# Patient Record
Sex: Female | Born: 1985 | Race: White | Hispanic: No | Marital: Married | Smoking: Former smoker
Health system: Southern US, Community
[De-identification: ages and names within clinical notes are randomized; demographics above are authoritative.]

## PROBLEM LIST (undated history)

## (undated) ENCOUNTER — Inpatient Hospital Stay (HOSPITAL_COMMUNITY): Payer: Self-pay

## (undated) DIAGNOSIS — F419 Anxiety disorder, unspecified: Secondary | ICD-10-CM

## (undated) DIAGNOSIS — N730 Acute parametritis and pelvic cellulitis: Secondary | ICD-10-CM

## (undated) DIAGNOSIS — J45909 Unspecified asthma, uncomplicated: Secondary | ICD-10-CM

## (undated) DIAGNOSIS — N390 Urinary tract infection, site not specified: Secondary | ICD-10-CM

## (undated) DIAGNOSIS — A749 Chlamydial infection, unspecified: Secondary | ICD-10-CM

## (undated) DIAGNOSIS — F32A Depression, unspecified: Secondary | ICD-10-CM

## (undated) DIAGNOSIS — E119 Type 2 diabetes mellitus without complications: Secondary | ICD-10-CM

## (undated) DIAGNOSIS — B999 Unspecified infectious disease: Secondary | ICD-10-CM

## (undated) DIAGNOSIS — F329 Major depressive disorder, single episode, unspecified: Secondary | ICD-10-CM

## (undated) DIAGNOSIS — M797 Fibromyalgia: Secondary | ICD-10-CM

## (undated) DIAGNOSIS — N189 Chronic kidney disease, unspecified: Secondary | ICD-10-CM

## (undated) DIAGNOSIS — T4145XA Adverse effect of unspecified anesthetic, initial encounter: Secondary | ICD-10-CM

## (undated) DIAGNOSIS — A549 Gonococcal infection, unspecified: Secondary | ICD-10-CM

## (undated) HISTORY — DX: Chlamydial infection, unspecified: A74.9

## (undated) HISTORY — DX: Adverse effect of unspecified anesthetic, initial encounter: T41.45XA

## (undated) HISTORY — DX: Acute parametritis and pelvic cellulitis: N73.0

## (undated) HISTORY — DX: Type 2 diabetes mellitus without complications: E11.9

## (undated) HISTORY — DX: Unspecified asthma, uncomplicated: J45.909

## (undated) HISTORY — DX: Anxiety disorder, unspecified: F41.9

## (undated) HISTORY — DX: Gonococcal infection, unspecified: A54.9

## (undated) HISTORY — DX: Fibromyalgia: M79.7

## (undated) HISTORY — DX: Unspecified infectious disease: B99.9

---
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
## 2004-01-21 ENCOUNTER — Emergency Department (HOSPITAL_COMMUNITY): Admission: EM | Admit: 2004-01-21 | Discharge: 2004-01-21 | Payer: Self-pay | Admitting: Unknown Physician Specialty

## 2004-03-26 ENCOUNTER — Ambulatory Visit: Payer: Self-pay | Admitting: Internal Medicine

## 2004-06-04 ENCOUNTER — Emergency Department (HOSPITAL_COMMUNITY): Admission: EM | Admit: 2004-06-04 | Discharge: 2004-06-05 | Payer: Self-pay | Admitting: Emergency Medicine

## 2004-06-24 ENCOUNTER — Ambulatory Visit: Payer: Self-pay | Admitting: Internal Medicine

## 2004-07-21 ENCOUNTER — Emergency Department (HOSPITAL_COMMUNITY): Admission: EM | Admit: 2004-07-21 | Discharge: 2004-07-21 | Payer: Self-pay | Admitting: Family Medicine

## 2004-07-25 DIAGNOSIS — N730 Acute parametritis and pelvic cellulitis: Secondary | ICD-10-CM

## 2004-07-25 DIAGNOSIS — A549 Gonococcal infection, unspecified: Secondary | ICD-10-CM

## 2004-07-25 DIAGNOSIS — A749 Chlamydial infection, unspecified: Secondary | ICD-10-CM

## 2004-07-25 HISTORY — DX: Acute parametritis and pelvic cellulitis: N73.0

## 2004-07-25 HISTORY — DX: Gonococcal infection, unspecified: A54.9

## 2004-07-25 HISTORY — DX: Chlamydial infection, unspecified: A74.9

## 2004-08-11 ENCOUNTER — Ambulatory Visit: Payer: Self-pay | Admitting: Internal Medicine

## 2005-04-19 ENCOUNTER — Other Ambulatory Visit: Admission: RE | Admit: 2005-04-19 | Discharge: 2005-04-19 | Payer: Self-pay | Admitting: Obstetrics and Gynecology

## 2005-04-22 ENCOUNTER — Emergency Department (HOSPITAL_COMMUNITY): Admission: EM | Admit: 2005-04-22 | Discharge: 2005-04-23 | Payer: Self-pay | Admitting: Emergency Medicine

## 2005-04-30 ENCOUNTER — Inpatient Hospital Stay (HOSPITAL_COMMUNITY): Admission: EM | Admit: 2005-04-30 | Discharge: 2005-05-01 | Payer: Self-pay | Admitting: Emergency Medicine

## 2005-09-27 ENCOUNTER — Emergency Department (HOSPITAL_COMMUNITY): Admission: EM | Admit: 2005-09-27 | Discharge: 2005-09-27 | Payer: Self-pay | Admitting: *Deleted

## 2005-10-11 ENCOUNTER — Emergency Department (HOSPITAL_COMMUNITY): Admission: EM | Admit: 2005-10-11 | Discharge: 2005-10-11 | Payer: Self-pay | Admitting: *Deleted

## 2006-01-13 ENCOUNTER — Emergency Department (HOSPITAL_COMMUNITY): Admission: EM | Admit: 2006-01-13 | Discharge: 2006-01-14 | Payer: Self-pay | Admitting: Emergency Medicine

## 2006-05-11 ENCOUNTER — Emergency Department (HOSPITAL_COMMUNITY): Admission: EM | Admit: 2006-05-11 | Discharge: 2006-05-11 | Payer: Self-pay | Admitting: Emergency Medicine

## 2007-09-10 IMAGING — CR DG CHEST 2V
2 series · 2 of 2 positions shown · non-contrast
Comparison: 09/27/05.

CLINICAL DATA: Chest pain, difficulty breathing.
 CHEST ? 2 VIEW:

[view not recorded (1 of 2)]
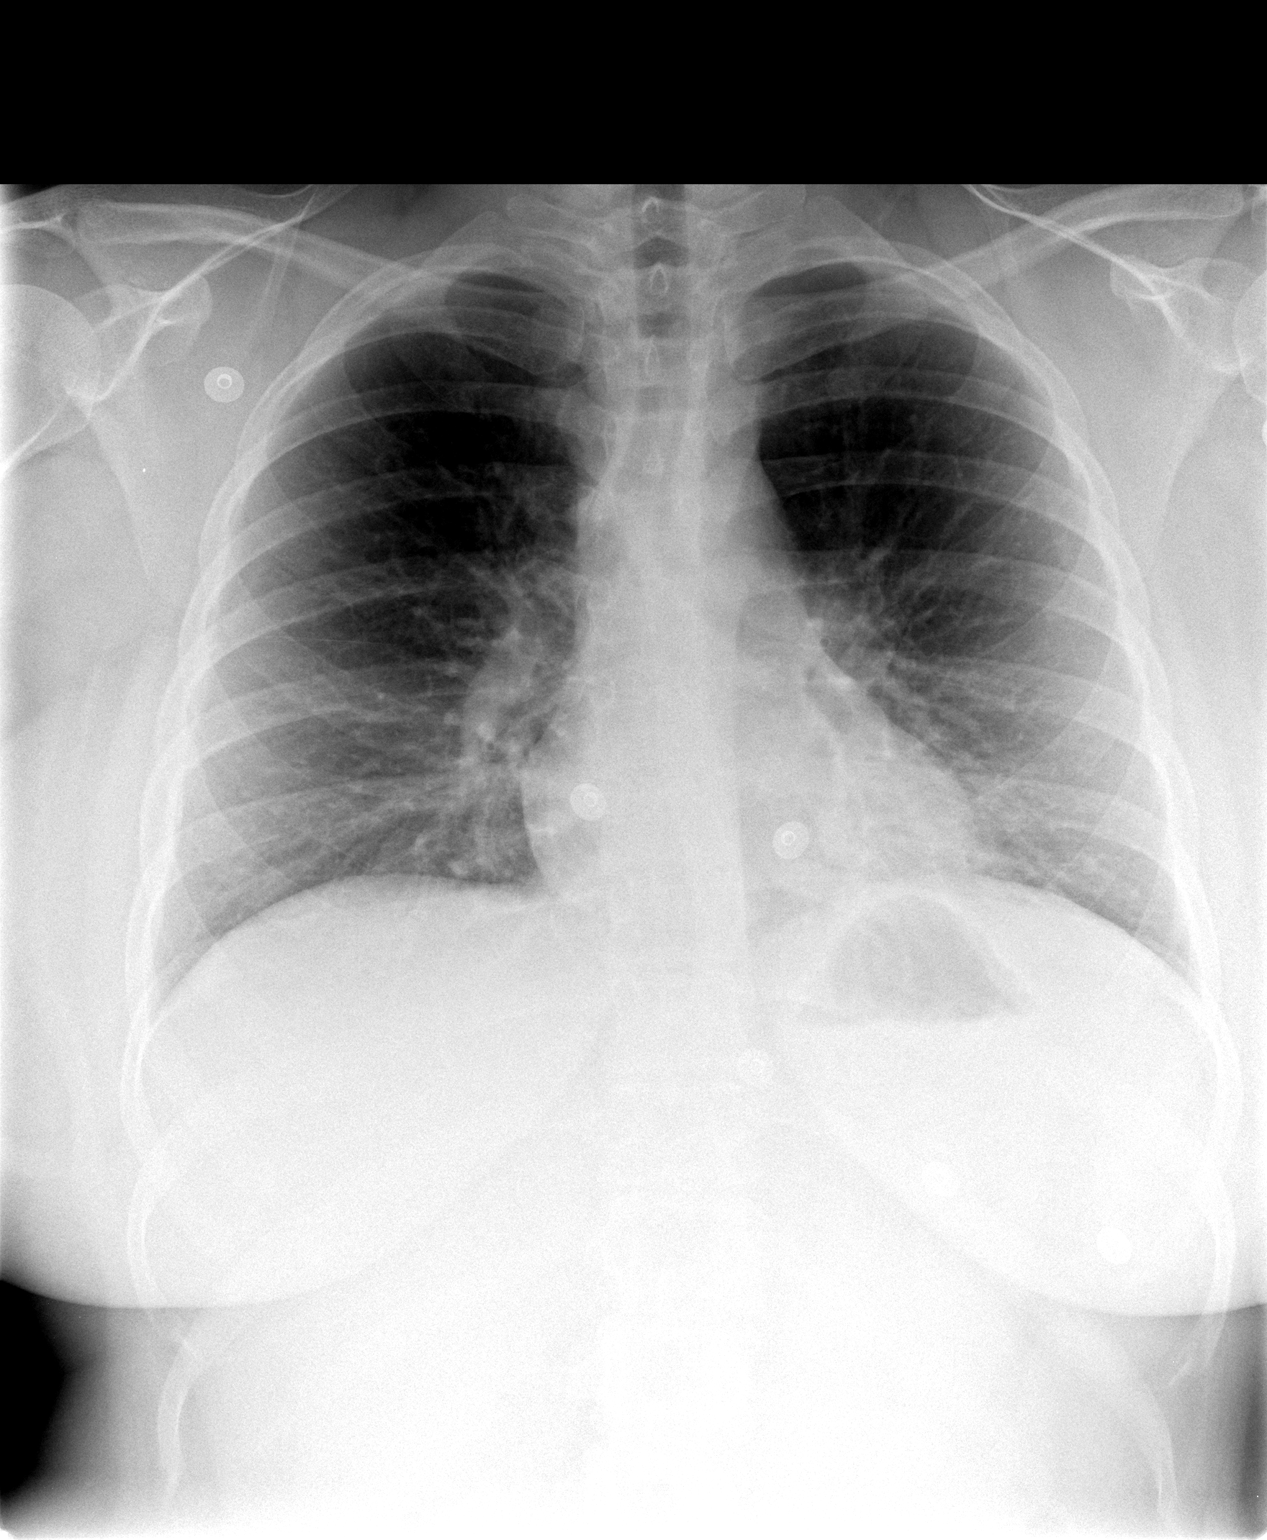

[view not recorded (2 of 2)]
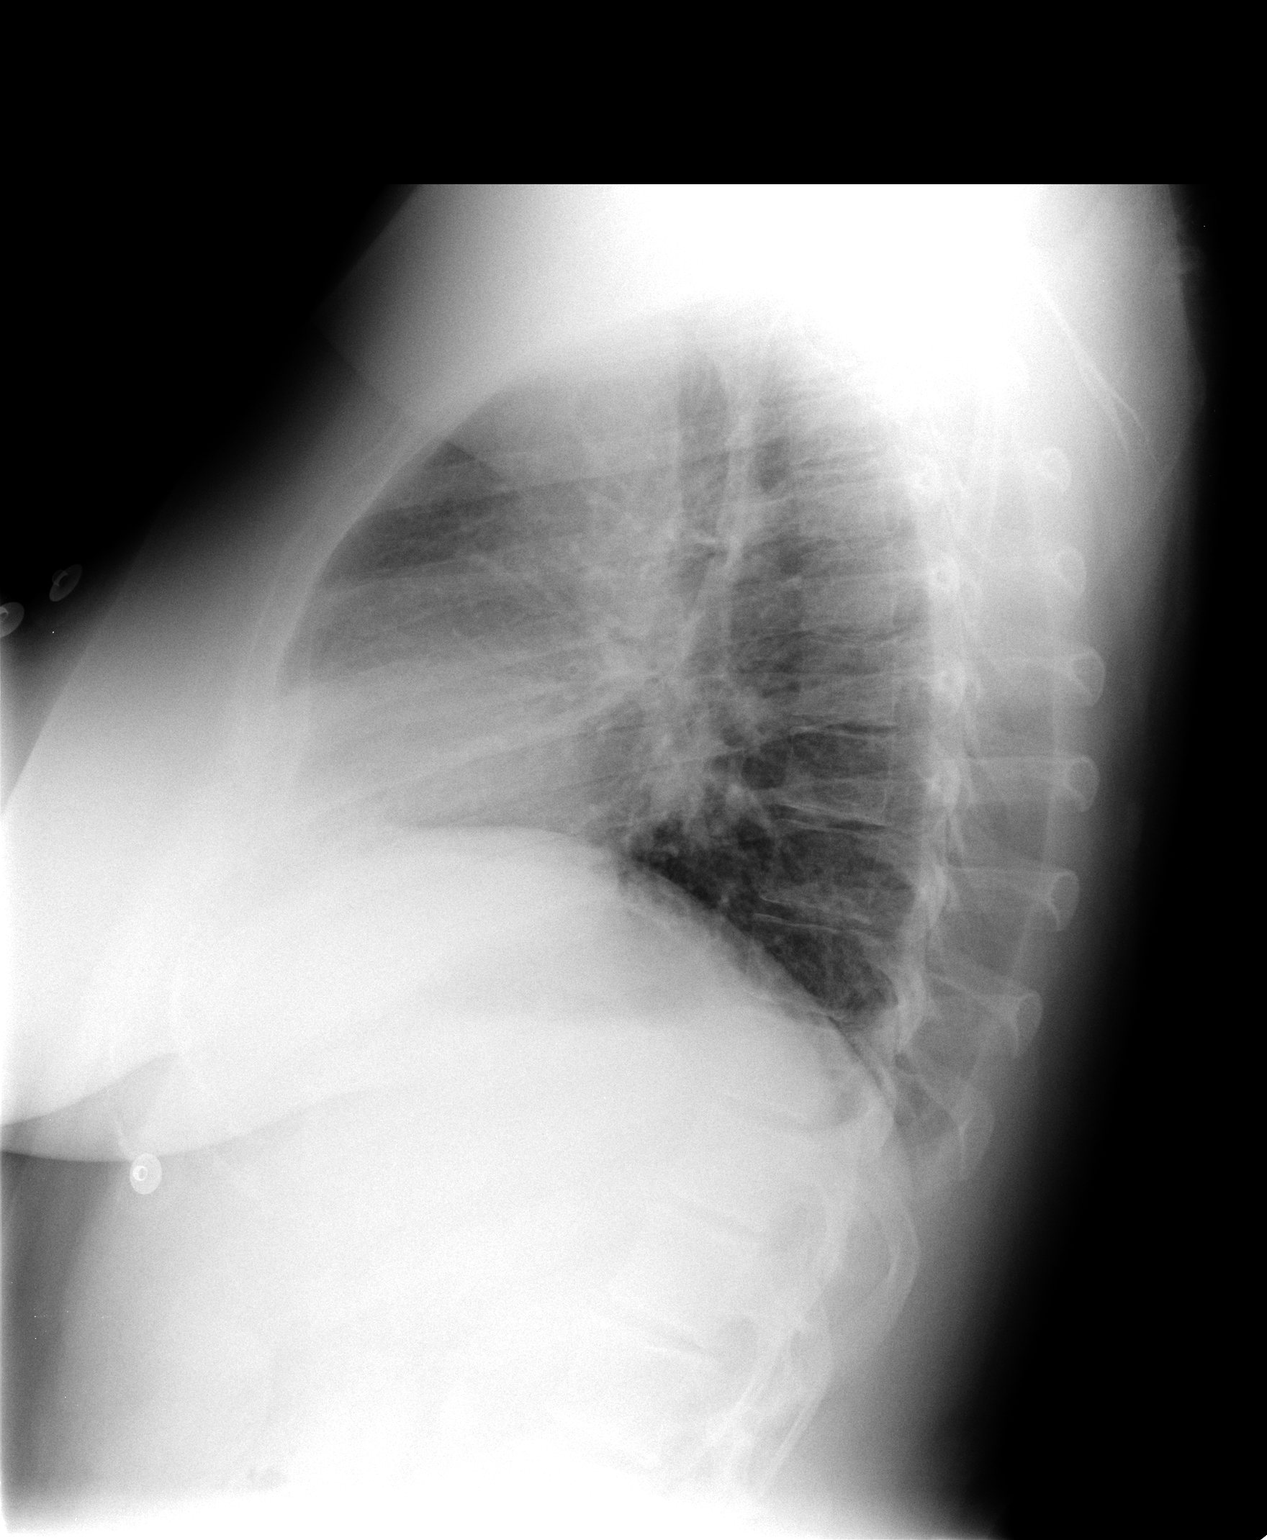

[2 of 2 positions shown; findings below may reference images not displayed]

FINDINGS: There is peribronchial thickening with more focal airspace disease in the lingula.  Right lung is clear.  The heart size is normal.  No effusions or focal bony abnormality.
IMPRESSION: Peribronchial thickening suggesting bronchitis.  More focal airspace opacity in the lingula could represent coexistent pneumonia or atelectasis.

## 2008-05-25 HISTORY — PX: APPENDECTOMY: SHX54

## 2009-07-25 DIAGNOSIS — T8859XA Other complications of anesthesia, initial encounter: Secondary | ICD-10-CM

## 2009-07-25 HISTORY — DX: Other complications of anesthesia, initial encounter: T88.59XA

## 2010-02-24 ENCOUNTER — Ambulatory Visit: Payer: Self-pay | Admitting: Obstetrics & Gynecology

## 2010-03-31 ENCOUNTER — Ambulatory Visit: Payer: Self-pay | Admitting: Obstetrics & Gynecology

## 2010-03-31 ENCOUNTER — Other Ambulatory Visit: Admission: RE | Admit: 2010-03-31 | Discharge: 2010-03-31 | Payer: Self-pay | Admitting: Obstetrics & Gynecology

## 2010-05-19 ENCOUNTER — Ambulatory Visit: Payer: Self-pay | Admitting: Obstetrics & Gynecology

## 2010-07-20 ENCOUNTER — Ambulatory Visit: Payer: Self-pay | Admitting: Obstetrics & Gynecology

## 2010-07-28 ENCOUNTER — Encounter: Payer: Self-pay | Admitting: Obstetrics & Gynecology

## 2010-07-28 LAB — CONVERTED CEMR LAB: TSH: 1.272 microintl units/mL (ref 0.350–4.500)

## 2010-12-07 NOTE — Assessment & Plan Note (Signed)
[name], [name]              ACCOUNT NO.:  [id number]   MEDICAL RECORD NO.:  [id number]          PATIENT TYPE:  POB   LOCATION:  [facility] [place]         FACILITY:  [facility]   PHYSICIAN:  Allie Bossier, MD        DATE OF BIRTH:  [birth year]   DATE OF SERVICE:  03/31/2010                                  CLINIC NOTE   Megan Castaneda is a 25 year old single white gravida 1, para 1.  She has a 60-  year-old son in [place].  She comes in here for annual exam.  She has  two complaints.  The first is that she has had some spotting on a daily  basis since she had her Mirena put in on August 3 of this year.  She  understands this is a normal side effect and will eventually go away.  Her other complaint is that of longstanding back pain.  She, in fact,  during her pregnancy was on narcotics and Flexeril and she will [facility]  [facility], but she is complaining of  some significant lower back pain.  Other past medical history besides  the lower back pain is morbid obesity and asthma.   PAST SURGICAL HISTORY:  Appendectomy, cholecystectomy, wisdom teeth  extraction.   SOCIAL HISTORY:  She smokes less than a pack a day and denies drugs or  alcohol use.  No latex allergies.   DRUG ALLERGIES:  AMOXICILLIN, SULFA.   CURRENT MEDICATIONS:  Prenatal vitamins daily, Tylenol daily, Naprosyn  as necessary.  She has a Mirena IUD in place.  She uses a Proventil  inhaler as necessary (she has used it about 3 times this past week).   FAMILY HISTORY:  Significant for breast cancer in a maternal grandmother  and maternal aunt.  Her mother had gallbladder cancer and thyroid  cancer.  She denies family history of colon and GYN cancers.   REVIEW OF SYSTEMS:  She began living with her boyfriend after 1 month of  dating.  Last Pap smear was in 2010.  The rest of the review of systems  questions are negative.   PHYSICAL EXAMINATION:  GENERAL:  Well-nourished, well-hydrated pleasant  white female.  VITAL SIGNS:  Height 5 feet 1 inch, weight 202 pounds, blood pressure  122/76, pulse 76.  BREASTS:  Normal breast.  No skin changes, nipple discharges, or masses.  HEART:  Regular rate and rhythm.  LUNGS:  Clear to auscultation bilaterally.  ABDOMEN:  Obese, benign.  EXTERNAL GENITALIA:  No lesions.  Cervix, small amount of bloody mucus  in the cervical os.  The strings are seen.  Uterus is about 6-week size,  anteverted, mobile, nontender.  Adnexa are nontender without masses.   ASSESSMENT AND PLAN:  1. Annual exam.  Checked the Pap smear with cervical cultures.      Recommended self-breast and self-vulvar exams.  2. Spotting with Mirena.  I told her that this is a known side effect      and that she should try some ibuprofen.  I have also told her that      if in a couple of weeks it is still a  problem that she can call      this office and we will call her in a month of birth control pills.  3. Lower back pain.  I have agreed to write 20 Vicodin to last her      until her visit with a primary care Megan Castaneda but after that I told      her we will not be providing Vicodin from this office.      Allie Bossier, MD     MCD/MEDQ  D:  03/31/2010  T:  03/31/2010  Job:  [id number]

## 2010-12-07 NOTE — Assessment & Plan Note (Signed)
[name], [name]              ACCOUNT NO.:  [id number]   MEDICAL RECORD NO.:  [id number]          PATIENT TYPE:  POB   LOCATION:  [facility] [place]         FACILITY:  [facility]   PHYSICIAN:  Allie Bossier, MD        DATE OF BIRTH:  [birth year]   DATE OF SERVICE:  05/19/2010                                  CLINIC NOTE   Nature is a 25 year old single white gravida 1, para 1.  She has a 20-1/2-  year-old son.  She comes in because she would like to have her IUD  removed.  Initially she told me it was because she did not like her IUD,  but now she is telling me that she and her boyfriend are wanting to have  another baby.  I have counseled her extensively that she should  multivitamins and folic acid or prenatal vitamins for the next 6 weeks  prior to conception and use condoms or withdrawal until then.  She  understands.  The IUD was removed without problem and she will follow up  in a year or p.r.n. sooner.      Allie Bossier, MD     MCD/MEDQ  D:  05/19/2010  T:  05/19/2010  Job:  [id number]

## 2010-12-07 NOTE — Assessment & Plan Note (Signed)
[name], Megan Castaneda              ACCOUNT NO.:  [id number]   MEDICAL RECORD NO.:  [id number]          PATIENT TYPE:  POB   LOCATION:  [facility] [place]         FACILITY:  [facility]   PHYSICIAN:  Elsie Lincoln, MD      DATE OF BIRTH:  [birth year]   DATE OF SERVICE:  07/27/2010                                  CLINIC NOTE   The patient is a 25 year old female who presents for amenorrhea.  The  patient has not had appeared for 2-1/2 months.  She had an IUD removed  on May 04, 2010.  Her menstrual history prior to this was normal  menses up until she got pregnant.  She had a baby, then had IUD  inserted, then she had the IUD removed, I do not believe that 2-1/2  months of period with a diagnosis of amenorrhea, but the patient seems  worried.  We will give her Provera withdrawal bleed in hope this reset  her cycles.  We did talk about weight loss.  The patient does not  exercise and seems to not really care about her diet at this point.  We  talked about long-term maintenance of being obese and also obesity in  pregnancy.  She wants to get pregnant, but then says she does not want  to get pregnant because she can afford a baby.  We talked a little bit  about this.  I do think they are trying actively, so which is why she is  upset.  Her UPT is negative today.  Provera was given 10 mg a day for 10  days.  The patient to expect withdrawal bleed in 2 weeks.  If she has  not, she will come back in a month and then we will address any issues  after that.           ______________________________  Elsie Lincoln, MD     KL/MEDQ  D:  07/27/2010  T:  07/28/2010  Job:  [id number]
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
## 2012-04-20 ENCOUNTER — Telehealth: Payer: Self-pay | Admitting: Obstetrics and Gynecology

## 2012-04-20 NOTE — Telephone Encounter (Signed)
Tc to pt per telephone call. Pt with cough, body aches and nasal congestion. No fever. Temp-96.6. Informed pt may try plain Robitussin, plain Sudafed, normal saline nasal rinses, increase water intake/Vit C. No sore throat. If no improvement, pt to cb. Pt voices understanding.

## 2012-04-25 ENCOUNTER — Ambulatory Visit (INDEPENDENT_AMBULATORY_CARE_PROVIDER_SITE_OTHER): Payer: Medicaid Other | Admitting: Obstetrics and Gynecology

## 2012-04-25 DIAGNOSIS — Z331 Pregnant state, incidental: Secondary | ICD-10-CM

## 2012-04-25 MED ORDER — ONDANSETRON 8 MG PO TBDP: 8.0000 mg | ORAL_TABLET | Freq: Three times a day (TID) | ORAL | Status: DC | PRN

## 2012-04-26 ENCOUNTER — Other Ambulatory Visit: Payer: Self-pay | Admitting: Obstetrics and Gynecology

## 2012-04-26 LAB — PRENATAL PANEL VII
Antibody Screen: NEGATIVE
Basophils Absolute: 0 10*3/uL (ref 0.0–0.1)
Basophils Relative: 0 % (ref 0–1)
Eosinophils Relative: 1 % (ref 0–5)
HCT: 36.8 % (ref 36.0–46.0)
HIV: NONREACTIVE
Hepatitis B Surface Ag: NEGATIVE
Lymphocytes Relative: 20 % (ref 12–46)
Lymphs Abs: 2.2 10*3/uL (ref 0.7–4.0)
MCH: 32.8 pg (ref 26.0–34.0)
MCHC: 36.1 g/dL — ABNORMAL HIGH (ref 30.0–36.0)
Monocytes Absolute: 0.5 10*3/uL (ref 0.1–1.0)
Monocytes Relative: 5 % (ref 3–12)
Neutro Abs: 7.9 10*3/uL — ABNORMAL HIGH (ref 1.7–7.7)
Neutrophils Relative %: 74 % (ref 43–77)
Platelets: 208 10*3/uL (ref 150–400)
RBC: 4.06 MIL/uL (ref 3.87–5.11)
RDW: 12.7 % (ref 11.5–15.5)
Rh Type: POSITIVE
Rubella: 85.3 IU/mL — ABNORMAL HIGH
WBC: 10.7 10*3/uL — ABNORMAL HIGH (ref 4.0–10.5)

## 2012-04-30 DIAGNOSIS — F419 Anxiety disorder, unspecified: Secondary | ICD-10-CM | POA: Insufficient documentation

## 2012-04-30 DIAGNOSIS — Z331 Pregnant state, incidental: Secondary | ICD-10-CM | POA: Insufficient documentation

## 2012-04-30 DIAGNOSIS — E669 Obesity, unspecified: Secondary | ICD-10-CM | POA: Insufficient documentation

## 2012-05-01 ENCOUNTER — Encounter (HOSPITAL_COMMUNITY): Payer: Self-pay | Admitting: Obstetrics and Gynecology

## 2012-05-01 ENCOUNTER — Inpatient Hospital Stay (HOSPITAL_COMMUNITY)
Admission: AD | Admit: 2012-05-01 | Discharge: 2012-05-02 | Disposition: A | Discharge status: Home / Self Care | Payer: Medicaid Other | Source: Ambulatory Visit | Attending: Obstetrics and Gynecology | Admitting: Obstetrics and Gynecology

## 2012-05-01 DIAGNOSIS — N760 Acute vaginitis: Secondary | ICD-10-CM | POA: Insufficient documentation

## 2012-05-01 DIAGNOSIS — E860 Dehydration: Secondary | ICD-10-CM

## 2012-05-01 DIAGNOSIS — Z331 Pregnant state, incidental: Secondary | ICD-10-CM

## 2012-05-01 DIAGNOSIS — R110 Nausea: Secondary | ICD-10-CM

## 2012-05-01 DIAGNOSIS — A499 Bacterial infection, unspecified: Secondary | ICD-10-CM | POA: Insufficient documentation

## 2012-05-01 DIAGNOSIS — R109 Unspecified abdominal pain: Secondary | ICD-10-CM | POA: Insufficient documentation

## 2012-05-01 DIAGNOSIS — B9689 Other specified bacterial agents as the cause of diseases classified elsewhere: Secondary | ICD-10-CM | POA: Insufficient documentation

## 2012-05-01 DIAGNOSIS — O2390 Unspecified genitourinary tract infection in pregnancy, unspecified trimester: Secondary | ICD-10-CM | POA: Insufficient documentation

## 2012-05-01 LAB — URINE MICROSCOPIC-ADD ON

## 2012-05-01 LAB — URINALYSIS, ROUTINE W REFLEX MICROSCOPIC
Bilirubin Urine: NEGATIVE
Glucose, UA: NEGATIVE mg/dL
Ketones, ur: NEGATIVE mg/dL
Leukocytes, UA: NEGATIVE
Nitrite: NEGATIVE
Specific Gravity, Urine: >1.03 — ABNORMAL HIGH (ref 1.005–1.030)
pH: 5.5 (ref 5.0–8.0)

## 2012-05-01 LAB — WET PREP, GENITAL
Trich, Wet Prep: NONE SEEN
Yeast Wet Prep HPF POC: NONE SEEN

## 2012-05-01 MED ORDER — ONDANSETRON 4 MG PO TBDP
4.0000 mg | ORAL_TABLET | Freq: Once | ORAL | Status: AC
  Administered 2012-05-01: 4 mg via ORAL
  Filled 2012-05-01: qty 1

## 2012-05-01 MED ORDER — METRONIDAZOLE 500 MG PO TABS
500.0000 mg | ORAL_TABLET | Freq: Once | ORAL | Status: AC
  Administered 2012-05-01: 500 mg via ORAL
  Filled 2012-05-01: qty 1

## 2012-05-01 MED ORDER — METRONIDAZOLE 500 MG PO TABS: 500.0000 mg | ORAL_TABLET | Freq: Two times a day (BID) | ORAL | Status: DC

## 2012-05-01 NOTE — MAU Note (Signed)
Pt reports she was walking today and started having pressure, lower back pain, frequency and pressure with urination. Denies fever

## 2012-05-01 NOTE — MAU Provider Note (Signed)
History   [name] presented this evening in early pregnancy at [redacted]w[redacted]d She has a history of n&v and had been prescribed Zofran 4mg s q. 6hrly bu HS but has not received her MedicAid card and was unable to afford to fill it. Tonight has nausea and dehydration and states "symptoms of a UTI"  CSN: [id number]  Arrival date and time: 05/01/12 2139   None     Chief Complaint  Patient presents with  . Abdominal Pain   HPI  OB History    Grav Para Term Preterm Abortions TAB SAB Ect Mult Living   2 1 1       1       Past Medical History  Diagnosis Date  . Complication of anesthesia 2011    Has ongoing shoulder and back pain from epidural  . Asthma     triggered by dust;colds;has inhaler prn  . Infection     Yeast;not frequent  . Infection     UTI;not frequent  . Chlamydia 2006    was treated  . Gonorrhea 2006    was treated  . PID (acute pelvic inflammatory disease) 2006    Antibxs;found out after + GC/CT  . Bipolar 1 disorder     Meds in the past  . Anxiety     Meds in the past  . Fibromyalgia     Past Surgical History  Procedure Date  . Appendectomy 05/2008  . Wisdom tooth extraction 1999    All 4 removed    Family History  Problem Relation Age of Onset  . Thyroid disease Mother   . Cancer Mother     Gallbladder  . Diabetes Maternal Grandmother   . Diabetes Maternal Grandfather   . Dementia Paternal Grandmother   . Dementia Paternal Grandfather   . Bipolar disorder Maternal Grandmother   . Depression Mother   . Anxiety disorder Mother   . Depression Maternal Grandmother   . Anxiety disorder Maternal Grandmother   . Fibromyalgia Mother   . Fibromyalgia Maternal Grandmother     History  Substance Use Topics  . Smoking status: Former Smoker    Types: Cigarettes    Quit date: 07/25/2010  . Smokeless tobacco: Never Used  . Alcohol Use: No    Allergies:  Allergies  Allergen Reactions  . Amoxicillin Rash  . Sulfa Antibiotics Rash     Prescriptions prior to admission  Medication Sig Dispense Refill  . acetaminophen (TYLENOL) 500 MG tablet Take 1,000 mg by mouth every 6 (six) hours as needed. For pain      . docusate sodium (COLACE) 100 MG capsule Take 200 mg by mouth daily as needed. For bm      . Prenatal Vit-Fe Fumarate-FA (PRENATAL MULTIVITAMIN) TABS Take 1 tablet by mouth every morning.      . ranitidine (ZANTAC) 150 MG tablet Take 150 mg by mouth daily as needed. For acid reflux        Review of Systems  Constitutional: Negative.   HENT: Negative.   Eyes: Negative.   Respiratory: Negative.   Cardiovascular: Negative.   Gastrointestinal: Positive for nausea.       Nausea most days. Has not been able to afford Zofran as prescribed until her Medic Aid card.  Genitourinary: Positive for urgency.       Patient feels pressure after voiding  Musculoskeletal: Negative.   Skin: Negative.   Neurological: Negative.   Psychiatric/Behavioral: Negative.    Physical Exam   Blood pressure 135/86, pulse  106, temperature 98.6 F (37 C), temperature source Oral, resp. rate 18, height 5\' 2"  (1.575 m), weight 227 lb (102.967 kg), last menstrual period 01/18/2012, SpO2 100.00%.  Physical Exam  Constitutional: She is oriented to person, place, and time. She appears well-developed and well-nourished.  HENT:  Head: Normocephalic and atraumatic.  Eyes: Conjunctivae normal and EOM are normal. Pupils are equal, round, and reactive to light.  Neck: Normal range of motion. Neck supple.  Cardiovascular: Normal rate, regular rhythm and normal heart sounds.   Respiratory: Effort normal and breath sounds normal.  GI: Soft. Bowel sounds are normal.  Genitourinary: Vagina normal and uterus normal.  Musculoskeletal: Normal range of motion.  Neurological: She is alert and oriented to person, place, and time. She has normal reflexes.  Skin: Skin is warm and dry.  Psychiatric: She has a normal mood and affect.    MAU Course   Procedures Wet prep U/A microscopy and culture  Assessment and Plan  Wet Prep: Clue cells c/w BV - to treat with Flagyl  500mg  po x 1  Prescribed  Flagyl 500 mgs po BID x 7 days) U/A - neg, small blood, no nitrates, no leukocytes - urine for culture.  Braidan Ricciardi, CNM. 05/01/2012, 10:30 PM

## 2012-05-02 ENCOUNTER — Encounter (HOSPITAL_COMMUNITY): Payer: Self-pay | Admitting: *Deleted

## 2012-05-02 ENCOUNTER — Telehealth: Payer: Self-pay | Admitting: Obstetrics and Gynecology

## 2012-05-02 ENCOUNTER — Inpatient Hospital Stay (HOSPITAL_COMMUNITY)
Admission: AD | Admit: 2012-05-02 | Discharge: 2012-05-02 | Disposition: A | Discharge status: Home / Self Care | Payer: Medicaid Other | Source: Ambulatory Visit | Attending: Obstetrics and Gynecology | Admitting: Obstetrics and Gynecology

## 2012-05-02 ENCOUNTER — Ambulatory Visit: Payer: Self-pay | Admitting: Obstetrics and Gynecology

## 2012-05-02 DIAGNOSIS — N200 Calculus of kidney: Secondary | ICD-10-CM | POA: Diagnosis not present

## 2012-05-02 DIAGNOSIS — R1011 Right upper quadrant pain: Secondary | ICD-10-CM

## 2012-05-02 DIAGNOSIS — N949 Unspecified condition associated with female genital organs and menstrual cycle: Secondary | ICD-10-CM | POA: Insufficient documentation

## 2012-05-02 DIAGNOSIS — O218 Other vomiting complicating pregnancy: Secondary | ICD-10-CM

## 2012-05-02 DIAGNOSIS — R109 Unspecified abdominal pain: Secondary | ICD-10-CM | POA: Insufficient documentation

## 2012-05-02 LAB — URINALYSIS, ROUTINE W REFLEX MICROSCOPIC
Bilirubin Urine: NEGATIVE
Glucose, UA: NEGATIVE mg/dL
Ketones, ur: NEGATIVE mg/dL
Leukocytes, UA: NEGATIVE
Nitrite: NEGATIVE
Protein, ur: NEGATIVE mg/dL
Specific Gravity, Urine: 1.01 (ref 1.005–1.030)
Urobilinogen, UA: 0.2 mg/dL (ref 0.0–1.0)
pH: 6 (ref 5.0–8.0)

## 2012-05-02 LAB — URINE MICROSCOPIC-ADD ON

## 2012-05-02 NOTE — MAU Note (Signed)
Pt states having less pain now, rates pain 4-5/10. Still feels pressure with voiding. Took phenergan before coming in to MAU. Was here last pm and given dilaudid and pill for pain. Was just seen at [facility] today. Left here last pm and went to [facility] after being seen. Has been feeling nauseated. Last ate chicken noodle soup at 1530. Was told by Dr. Pennie Rushing to come here to be re-evaluated.

## 2012-05-02 NOTE — Telephone Encounter (Signed)
Telephone call from Dr. Dorris Carnes to my office was returned by me, concerning patient Megan Castaneda who is being seen at the emergency center in [place] run via [facility]. She presented by EMS complaining of abdominal pain nausea and vomiting and was felt to be dehydrated. He calls to see if we are able to see the patient here. I responded to him that the patient has an appointment in our office at 3:30 today which is listed as canceled in EP[facility].  I also relayed to him that the patient was seen on 05/01/2012 in the maternity admissions area, and certainly can return to that area for care by our providers today. I am not certain why the patient went to the [place] facility rather than coming to [facility]. He agreed that he would hydrate the patient and give her anti-emetics to determine whether she can tolerate oral feedings. He will also encourage her to keep her next appointment with [place]. He will likewise encourage her that if she needs in-hospital care she will need to be seen at [facility] as none of our [place] providers have privileges at the [place] location. I gave him my direct telephone number to contact me with any further questions or with the need to transfer the patient today.

## 2012-05-02 NOTE — MAU Note (Signed)
Lower abd/pelvic pain started last night.  Got worse after was seen here, pain was lower pelvic around to the back.- ended up going to [place] last night and again this morning (by EMS- lives near there)   Pain is better now. Does not feel near as bad now.

## 2012-05-02 NOTE — Progress Notes (Addendum)
History   26 yo G2P1001 aqt 15 weeks presented unannounced after being seen at [facility]) for evaluation of persistent lower abdominal pain.  Was seen in MAU last evening, then went to [place] facility after leaving here when she didn't feel better, then returned there this am with same complaint.  She had IV fluids, pain medication, and a normal renal US, although the providers there reported to the patient she may have had a small stone that was unable to be seen on imaging.  In MAU last night, she had a pelvic exam and was treated for BV.  Reports her pain worsened after taking the Metronidazole po.  Dr. Dorris Carnes from the Urgent Care contacted Dr. Pennie Rushing this am (see her note) and indicated he would be treating her with IVF and anti-emetics, with the plan to send her home if she was able to keep down fluids.    The patient presented to MAU reporting she was directed to come here for follow-up.  She notes feeling much better now, with minimal pain.  Keeping soup and fluids down today.  Denies bleeding, leaking, fever, dysuria, or any other symptoms.  Had NOB interview on 10/2 with labs--no urine culture resulted.  No urine culture sent last night.  Had NOB w/u scheduled today--has r/s'd that to 10/18.  Chief Complaint  Patient presents with  . R sided kidney stone   This is a presumptive diagnosis at present.   OB History    Grav Para Term Preterm Abortions TAB SAB Ect Mult Living   2 1 1       1       Past Medical History  Diagnosis Date  . Complication of anesthesia 2011    Has ongoing shoulder and back pain from epidural  . Asthma     triggered by dust;colds;has inhaler prn  . Infection     Yeast;not frequent  . Infection     UTI;not frequent  . Chlamydia 2006    was treated  . Gonorrhea 2006    was treated  . PID (acute pelvic inflammatory disease) 2006    Antibxs;found out after + GC/CT  . Bipolar 1 disorder     Meds in the past  .  Anxiety     Meds in the past  . Fibromyalgia     Past Surgical History  Procedure Date  . Appendectomy 05/2008  . Wisdom tooth extraction 1999    All 4 removed    Family History  Problem Relation Age of Onset  . Thyroid disease Mother   . Cancer Mother     Gallbladder  . Depression Mother   . Anxiety disorder Mother   . Fibromyalgia Mother   . Diabetes Maternal Grandmother   . Bipolar disorder Maternal Grandmother   . Depression Maternal Grandmother   . Anxiety disorder Maternal Grandmother   . Fibromyalgia Maternal Grandmother   . Diabetes Maternal Grandfather   . Dementia Paternal Grandmother   . Dementia Paternal Grandfather   . Other Neg Hx     History  Substance Use Topics  . Smoking status: Former Smoker    Types: Cigarettes    Quit date: 07/25/2010  . Smokeless tobacco: Never Used  . Alcohol Use: No    Allergies:  Allergies  Allergen Reactions  . Amoxicillin Rash  . Sulfa Antibiotics Rash    Prescriptions prior to admission  Medication Sig Dispense Refill  . acetaminophen (TYLENOL) 500 MG tablet Take 1,000 mg by  mouth every 6 (six) hours as needed. For pain      . docusate sodium (COLACE) 100 MG capsule Take 200 mg by mouth daily as needed. For bm      . metroNIDAZOLE (FLAGYL) 500 MG tablet Take 1 tablet (500 mg total) by mouth 2 (two) times daily before a meal.  14 tablet  0  . ondansetron (ZOFRAN) 4 MG/5ML solution Take by mouth once.      Marland Kitchen oxyCODONE-acetaminophen (PERCOCET/ROXICET) 5-325 MG per tablet Take 1 tablet by mouth every 4 (four) hours as needed.      . Prenatal Vit-Fe Fumarate-FA (PRENATAL MULTIVITAMIN) TABS Take 1 tablet by mouth every morning.      . ranitidine (ZANTAC) 150 MG tablet Take 150 mg by mouth daily as needed. For acid reflux         Physical Exam   Blood pressure 115/68, pulse 104, temperature 99.2 F (37.3 C), temperature source Oral, resp. rate 16, height 5\' 2"  (1.575 m), weight 228 lb 8 oz (103.647 kg), last menstrual  period 01/18/2012.  In NAD Chest clear Heart RRR without murmur Abd gravid, NT. Pelvic--deferred at present Ext WNL Back--mild right CVAT tenderness  FHR 160  Results for orders placed during the hospital encounter of 05/02/12 (from the past 24 hour(s))  URINALYSIS, ROUTINE W REFLEX MICROSCOPIC     Status: Abnormal   Collection Time   05/02/12  4:30 PM      Component Value Range   Color, Urine YELLOW  YELLOW   APPearance CLEAR  CLEAR   Specific Gravity, Urine 1.010  1.005 - 1.030   pH 6.0  5.0 - 8.0   Glucose, UA NEGATIVE  NEGATIVE mg/dL   Hgb urine dipstick MODERATE (*) NEGATIVE   Bilirubin Urine NEGATIVE  NEGATIVE   Ketones, ur NEGATIVE  NEGATIVE mg/dL   Protein, ur NEGATIVE  NEGATIVE mg/dL   Urobilinogen, UA 0.2  0.0 - 1.0 mg/dL   Nitrite NEGATIVE  NEGATIVE   Leukocytes, UA NEGATIVE  NEGATIVE  URINE MICROSCOPIC-ADD ON     Status: Abnormal   Collection Time   05/02/12  4:30 PM      Component Value Range   Squamous Epithelial / LPF FEW (*) RARE   WBC, UA 0-2  <3 WBC/hpf   RBC / HPF 0-2  <3 RBC/hpf   Bacteria, UA RARE  RARE     ED Course  IUP at 15 weeks ? Right kidney stone  Plan: ROI to [facility] facility for records. Anticipate d/c home--will ensure has meds for nausea and pain. Will give urine strainer and encourage pushing po fluids. UA today and urine to culture.    Nigel Bridgeman CNM, [place] 05/02/2012 5:40 PM

## 2012-05-04 LAB — URINE CULTURE
Colony Count: NO GROWTH
Culture: NO GROWTH

## 2012-05-11 ENCOUNTER — Ambulatory Visit (INDEPENDENT_AMBULATORY_CARE_PROVIDER_SITE_OTHER): Payer: Medicaid Other | Admitting: Obstetrics and Gynecology

## 2012-05-11 ENCOUNTER — Encounter: Payer: Medicaid Other | Admitting: Obstetrics and Gynecology

## 2012-05-11 ENCOUNTER — Ambulatory Visit (INDEPENDENT_AMBULATORY_CARE_PROVIDER_SITE_OTHER): Payer: Medicaid Other

## 2012-05-11 ENCOUNTER — Encounter: Payer: Self-pay | Admitting: Obstetrics and Gynecology

## 2012-05-11 VITALS — BP 118/70 | Wt 225.0 lb

## 2012-05-11 DIAGNOSIS — N83299 Other ovarian cyst, unspecified side: Secondary | ICD-10-CM | POA: Insufficient documentation

## 2012-05-11 DIAGNOSIS — O26849 Uterine size-date discrepancy, unspecified trimester: Secondary | ICD-10-CM

## 2012-05-11 DIAGNOSIS — Z331 Pregnant state, incidental: Secondary | ICD-10-CM

## 2012-05-11 DIAGNOSIS — O26841 Uterine size-date discrepancy, first trimester: Secondary | ICD-10-CM

## 2012-05-11 DIAGNOSIS — N83209 Unspecified ovarian cyst, unspecified side: Secondary | ICD-10-CM

## 2012-05-11 DIAGNOSIS — Z23 Encounter for immunization: Secondary | ICD-10-CM

## 2012-05-11 LAB — POCT WET PREP (WET MOUNT): Clue Cells Wet Prep Whiff POC: NEGATIVE

## 2012-05-11 LAB — US OB COMP LESS 14 WKS

## 2012-05-11 MED ORDER — CYCLOBENZAPRINE HCL 10 MG PO TABS: 10.0000 mg | ORAL_TABLET | Freq: Three times a day (TID) | ORAL | Status: DC | PRN

## 2012-05-11 MED ORDER — SERTRALINE HCL 25 MG PO TABS: 25.0000 mg | ORAL_TABLET | Freq: Every day | ORAL | Status: DC

## 2012-05-11 MED ORDER — CITRANATAL HARMONY 27-1-250 MG PO CAPS: 1.0000 | ORAL_CAPSULE | Freq: Every day | ORAL | Status: DC

## 2012-05-11 NOTE — Progress Notes (Signed)
[redacted]w[redacted]d Wants genetic screenings  Flu shot given w/o difficulty

## 2012-05-11 NOTE — Progress Notes (Signed)
Subjective:    Megan Castaneda is being seen today for her first obstetrical visit at [redacted]w[redacted]d gestation by LMP (normal cycles and normal LMP), but 13 5/7 weeks by 7 4/7 week [place] by outside agency, with 1 cm complex cyst on left ovary.  Seen in MAU 10/9 for presumptive kidney stone issue.  No recurrence of sx.  She reports pre-existing and current issues with depression and anxiety--has relationship with counselor, but still having issues.  Willing to start medication.    Her obstetrical history is significant for: Patient Active Problem List  Diagnosis  . Pregnant state, incidental  . Obesity  . Anxiety  . Kidney stone  Hx residual back pain from epidural--had severe back and shoulder pain with epidural at last birth, with persistence of pain at times.    Relationship with FOB:  Supportive, married, Jahlia Omura (FOB of this baby)  Feeding plan:  Breast  Pregnancy history fully reviewed.  The following portions of the patient's history were reviewed and updated as appropriate: allergies, current medications, past family history, past medical history, past social history, past surgical history and problem list.  Review of Systems Pertinent ROS is described in HPI   Objective:   BP 118/70  Wt 225 lb (102.059 kg)  LMP 01/18/2012  Breastfeeding? Unknown Wt Readings from Last 1 Encounters:  05/11/12 225 lb (102.059 kg)   BMI: There is no height on file to calculate BMI.  General: alert, cooperative and no distress Respiratory: clear to auscultation bilaterally Cardiovascular: regular rate and rhythm, S1, S2 normal, no murmur Breasts:  No dominant masses, nipples erect Gastrointestinal: soft, non-tender; no masses,  no organomegaly Extremities: extremities normal, no pain or edema Vaginal Bleeding: None  EXTERNAL GENITALIA: normal appearing vulva with no masses, tenderness or lesions VAGINA: no abnormal discharge or lesions CERVIX: no lesions or cervical motion tenderness;  cervix closed, long, firm UTERUS: gravid and consistent with 14-16 weeks ADNEXA: no masses palpable and nontender OB EXAM PELVIMETRY: appears adequate  FHR:  160  bpm  [place] today: SIUP, 14 1/7 weeks, EDC 11/08/12, ovaries WNL.  Dating c/w early [place], not LMP.  Assessment:    Pregnancy at  14-16 weeks (discrepancy between normal LMP dating and early [place] from non-OB provider) Anxiety/depression Chronic back pain Elevated BMI Plan:     Prenatal panel reviewed and discussed with the patient:  Discussed today Pap smear collected: Done at 2013 [facility]--patient to bring letter regarding results GC/Chlamydia collected:yes Wet prep:  Negative Discussion of Genetic testing options: Quad screen today Prenatal vitamins recommended--reviewed previous samples given.  Wants Citranatal Harmony gel caps Problem list reviewed and updated.  Plan of care: Follow up in 4 weeks for ROB and [place] for anatomy. Plan early glucola at [facility]. Rx Zoloft 25 mg po q day x 1 week, then increase to 50 mg po daily. Rx Flexeril 10 mg po TID prn. Patient to continue counseling--reviewed need to notify us for any increase in sx, SI/HI, or present to [facility] for acute psychological issues. Will make sure Quad screen is calculated by verified dating. Changed pharmacy of choice to [facility] (from [facility] [place]). Rx Citranatal Harmony per patient request.  Nigel Bridgeman, CNM, [place]

## 2012-05-14 ENCOUNTER — Telehealth: Payer: Self-pay

## 2012-05-14 LAB — AFP, QUAD SCREEN
AFP: 10.3 IU/mL
Age Alone: 1:850 {titer}
HCG, Total: 18004 m[IU]/mL
Interpretation-AFP: NEGATIVE
MoM for AFP: 0.6
MoM for hCG: 0.66
Open Spina bifida: NEGATIVE
Tri 18 Scr Risk Est: NEGATIVE
uE3 Mom: 0.46

## 2012-05-14 NOTE — Telephone Encounter (Signed)
Flexeril called in to pharmacy due to not receiving on Friday 05-11-12.  Flexeril 10mg  1 TID  #30 with 2 rf.  ld

## 2012-05-16 ENCOUNTER — Other Ambulatory Visit: Payer: Self-pay | Admitting: Obstetrics and Gynecology

## 2012-05-28 ENCOUNTER — Encounter (HOSPITAL_COMMUNITY): Payer: Self-pay

## 2012-05-28 ENCOUNTER — Inpatient Hospital Stay (HOSPITAL_COMMUNITY)
Admission: AD | Admit: 2012-05-28 | Discharge: 2012-05-28 | Disposition: A | Discharge status: Home / Self Care | Payer: Medicaid Other | Source: Ambulatory Visit | Attending: Obstetrics and Gynecology | Admitting: Obstetrics and Gynecology

## 2012-05-28 DIAGNOSIS — Z331 Pregnant state, incidental: Secondary | ICD-10-CM

## 2012-05-28 DIAGNOSIS — N200 Calculus of kidney: Secondary | ICD-10-CM

## 2012-05-28 DIAGNOSIS — Z889 Allergy status to unspecified drugs, medicaments and biological substances status: Secondary | ICD-10-CM | POA: Diagnosis present

## 2012-05-28 DIAGNOSIS — O99891 Other specified diseases and conditions complicating pregnancy: Secondary | ICD-10-CM | POA: Insufficient documentation

## 2012-05-28 DIAGNOSIS — H6690 Otitis media, unspecified, unspecified ear: Secondary | ICD-10-CM | POA: Diagnosis present

## 2012-05-28 DIAGNOSIS — H9209 Otalgia, unspecified ear: Secondary | ICD-10-CM | POA: Insufficient documentation

## 2012-05-28 MED ORDER — HYDROCODONE-ACETAMINOPHEN 5-325 MG PO TABS: 1.0000 | ORAL_TABLET | Freq: Four times a day (QID) | ORAL | Status: DC | PRN

## 2012-05-28 MED ORDER — DOCUSATE SODIUM 100 MG PO CAPS: 100.0000 mg | ORAL_CAPSULE | Freq: Two times a day (BID) | ORAL | Status: DC

## 2012-05-28 MED ORDER — RANITIDINE HCL 300 MG PO TABS: 300.0000 mg | ORAL_TABLET | Freq: Every day | ORAL | Status: DC

## 2012-05-28 MED ORDER — AZITHROMYCIN 250 MG PO TABS: ORAL_TABLET | ORAL | Status: DC

## 2012-05-28 MED ORDER — PROMETHAZINE HCL 12.5 MG PO TABS: 25.0000 mg | ORAL_TABLET | Freq: Four times a day (QID) | ORAL | Status: DC | PRN

## 2012-05-28 NOTE — MAU Note (Signed)
Patient states she started having right ear pain yesterday. Has had a ear infection recently and feels like this is the same thing. Denies any pregnancy problems.

## 2012-05-29 DIAGNOSIS — Z889 Allergy status to unspecified drugs, medicaments and biological substances status: Secondary | ICD-10-CM | POA: Diagnosis present

## 2012-05-29 NOTE — MAU Provider Note (Signed)
History   26 yo G3P1011 at 49 2/7 weeks presented unannounced c/o right ear pain and drainage--"think it's another ear infection" (had one earlier this year).  Denies fever, bleeding from ear, sore throat, or any other sx.  Does have seasonal allergies, with drainage noted by patient.  Chief Complaint  Patient presents with  . Otalgia   Patient Active Problem List  Diagnosis  . Pregnant state, incidental  . Obesity  . Anxiety  . Kidney stone  . Ear infection  . Multiple drug allergies--ibuprophen, amoxicillin, sulfa     OB History    Grav Para Term Preterm Abortions TAB SAB Ect Mult Living   3 1 1       1       Past Medical History  Diagnosis Date  . Complication of anesthesia 2011    Has ongoing shoulder and back pain from epidural  . Asthma     triggered by dust;colds;has inhaler prn  . Infection     Yeast;not frequent  . Infection     UTI;not frequent  . Chlamydia 2006    was treated  . Gonorrhea 2006    was treated  . PID (acute pelvic inflammatory disease) 2006    Antibxs;found out after + GC/CT  . Bipolar 1 disorder     Meds in the past  . Anxiety     Meds in the past  . Fibromyalgia     Past Surgical History  Procedure Date  . Appendectomy 05/2008  . Wisdom tooth extraction 1999    All 4 removed    Family History  Problem Relation Age of Onset  . Thyroid disease Mother   . Cancer Mother     Gallbladder  . Depression Mother   . Anxiety disorder Mother   . Fibromyalgia Mother   . Diabetes Maternal Grandmother   . Bipolar disorder Maternal Grandmother   . Depression Maternal Grandmother   . Anxiety disorder Maternal Grandmother   . Fibromyalgia Maternal Grandmother   . Diabetes Maternal Grandfather   . Dementia Paternal Grandmother   . Dementia Paternal Grandfather   . Other Neg Hx     History  Substance Use Topics  . Smoking status: Former Smoker    Types: Cigarettes    Quit date: 07/25/2010  . Smokeless tobacco: Never Used  . Alcohol  Use: No    Allergies:  Allergies  Allergen Reactions  . Motrin (Ibuprofen) Other (See Comments)    Stomach pain  . Amoxicillin Rash  . Sulfa Antibiotics Rash    No prescriptions prior to admission     Physical Exam   Blood pressure 123/76, pulse 106, temperature 98.3 F (36.8 C), temperature source Oral, resp. rate 18, last menstrual period 01/18/2012, SpO2 100.00%.  Throat clear Ears--left ear canal and drum clear.  Right ear canal red, with cerumen/exudate noted.  Drum slightly full.  External ear slightly tender to touch, but no erythema or edema. Nasal passages clear. Chest clear Heart RRR without murmur Abd gravid, NT Pelvic--deferred Ext WNL  FHR 150s by doppler.   ED Course  IUP at 16 2/7 weeks Right ear otitis media  Plan: D/C home with comfort measures discussed Rx Zpak, Vicodin for pain Requested refills on Zantac, Phenergan, and rx for Colace to see if insurance will cover. Keep scheduled appt at [facility] or call prn.   Nigel Bridgeman CNM, [place] 05/28/12 9:20p

## 2012-05-30 ENCOUNTER — Telehealth: Payer: Self-pay | Admitting: Obstetrics and Gynecology

## 2012-05-30 NOTE — Telephone Encounter (Signed)
Tc to pt per telephone call. Pt c/o increased right ear pain with yellow drainage. Pt tx for right ear infec on 05/28/12 with Z pack by VL. Pt states,"has 2 pills left and sx's haven't got any better". Consulted with SL, pt to go to urgent care or PCP for further eval. Pt agrees.

## 2012-05-30 NOTE — Telephone Encounter (Signed)
VL to address. VL I spoke with pt this am rgdg this concern. Per SL, pt to go to urgent care or PCP for eval.

## 2012-05-31 NOTE — Telephone Encounter (Signed)
Spoke with patient--she had worsening sx of right ear infection. Went to Urgent Care, is undergoing treatment with ear wick, ATB and pain drops. Will f/u with them if sx do not abate. Nigel Bridgeman, CNM 05/30/12 7pm

## 2012-06-13 ENCOUNTER — Ambulatory Visit (INDEPENDENT_AMBULATORY_CARE_PROVIDER_SITE_OTHER): Payer: Medicaid Other | Admitting: Obstetrics and Gynecology

## 2012-06-13 ENCOUNTER — Ambulatory Visit (INDEPENDENT_AMBULATORY_CARE_PROVIDER_SITE_OTHER): Payer: Medicaid Other

## 2012-06-13 ENCOUNTER — Encounter: Payer: Self-pay | Admitting: Obstetrics and Gynecology

## 2012-06-13 VITALS — BP 110/66 | Wt 224.0 lb

## 2012-06-13 DIAGNOSIS — Z331 Pregnant state, incidental: Secondary | ICD-10-CM

## 2012-06-13 DIAGNOSIS — Z3689 Encounter for other specified antenatal screening: Secondary | ICD-10-CM

## 2012-06-13 LAB — US OB COMP + 14 WK

## 2012-06-13 NOTE — Progress Notes (Signed)
Doing well--ear infection finally resolved. Quad screen WNL. [place] today for anatomy:  SIUP, 18 2/7 weeks, [facility] 11/12/12 c/w dates. Cervix 4.07.  Posterior placenta.  Breech, normal fluid.  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   Will repeat US at [facility].

## 2012-06-13 NOTE — Progress Notes (Signed)
[redacted]w[redacted]d Recent hospital visit due to ear infection  Anatomy not originally scheduled but worked in today at 10:30 am 1 gtt given today w/o difficulty

## 2012-06-14 ENCOUNTER — Telehealth: Payer: Self-pay

## 2012-06-14 ENCOUNTER — Other Ambulatory Visit (HOSPITAL_COMMUNITY): Payer: Self-pay | Admitting: Obstetrics and Gynecology

## 2012-06-14 ENCOUNTER — Other Ambulatory Visit: Payer: Self-pay | Admitting: Obstetrics and Gynecology

## 2012-06-14 LAB — GLUCOSE TOLERANCE, 1 HOUR (50G) W/O FASTING: Glucose, 1 Hour GTT: 151 mg/dL — ABNORMAL HIGH (ref 70–140)

## 2012-06-14 MED ORDER — PROMETHAZINE HCL 25 MG PO TABS: ORAL_TABLET | ORAL | Status: DC

## 2012-06-14 MED ORDER — ONDANSETRON HCL 4 MG/5ML PO SOLN: 4.0000 mg | Freq: Once | ORAL | Status: DC

## 2012-06-14 NOTE — Telephone Encounter (Signed)
Informed pt will consult with VL regarding rf.

## 2012-06-14 NOTE — Telephone Encounter (Signed)
Pt needs phenergan and zofran refill. Informed pt will consult with VL.

## 2012-06-14 NOTE — Telephone Encounter (Signed)
Spoke with pt request Phenergan and Zofran

## 2012-06-14 NOTE — Telephone Encounter (Signed)
Took care of these Rxs for the patient. Hope I did them all correctly.... Looks like she had Zofran solution, not tablets, so that's what I Rx'd. Changed the Phenergan to 25 mg tablets, not 12.5.  Thanks! VL

## 2012-06-15 ENCOUNTER — Telehealth: Payer: Self-pay

## 2012-06-15 ENCOUNTER — Other Ambulatory Visit: Payer: Self-pay

## 2012-06-15 ENCOUNTER — Telehealth: Payer: Self-pay | Admitting: Obstetrics and Gynecology

## 2012-06-15 DIAGNOSIS — O24419 Gestational diabetes mellitus in pregnancy, unspecified control: Secondary | ICD-10-CM

## 2012-06-15 DIAGNOSIS — Z3689 Encounter for other specified antenatal screening: Secondary | ICD-10-CM

## 2012-06-15 NOTE — Telephone Encounter (Signed)
A user error has taken place: encounter opened in error, closed for administrative reasons.

## 2012-06-15 NOTE — Telephone Encounter (Signed)
Informed pt Zofran and Phenergan was approved with 2 rf per VL. Pt agrees and voices understanding.

## 2012-06-15 NOTE — Telephone Encounter (Signed)
Spoke with the pharmacist at [facility] on clarification on pt's rx .

## 2012-06-18 ENCOUNTER — Other Ambulatory Visit: Payer: Self-pay

## 2012-06-18 DIAGNOSIS — O24419 Gestational diabetes mellitus in pregnancy, unspecified control: Secondary | ICD-10-CM

## 2012-06-26 ENCOUNTER — Encounter: Payer: Self-pay | Admitting: Obstetrics and Gynecology

## 2012-06-26 ENCOUNTER — Telehealth: Payer: Self-pay

## 2012-06-26 DIAGNOSIS — O24419 Gestational diabetes mellitus in pregnancy, unspecified control: Secondary | ICD-10-CM

## 2012-06-26 LAB — GLUCOSE TOLERANCE, 3 HOURS
Glucose Tolerance, 2 hour: 189 mg/dL — ABNORMAL HIGH (ref 70–164)
Glucose Tolerance, Fasting: 83 mg/dL (ref 70–104)
Glucose, GTT - 3 Hour: 149 mg/dL — ABNORMAL HIGH (ref 70–144)

## 2012-06-26 NOTE — Telephone Encounter (Signed)
Spoke with pt regarding 3 gtt. Informed pt [facility] will be giving her call regarding an appointment due to GDM. Pt agreed and voices understanding. Referral sent to N & D today per VL.

## 2012-06-27 ENCOUNTER — Encounter: Payer: Medicaid Other | Attending: Obstetrics and Gynecology | Admitting: *Deleted

## 2012-06-27 VITALS — Ht 63.0 in | Wt 227.8 lb

## 2012-06-27 DIAGNOSIS — Z713 Dietary counseling and surveillance: Secondary | ICD-10-CM | POA: Insufficient documentation

## 2012-06-27 DIAGNOSIS — O99810 Abnormal glucose complicating pregnancy: Secondary | ICD-10-CM | POA: Insufficient documentation

## 2012-06-27 DIAGNOSIS — O24419 Gestational diabetes mellitus in pregnancy, unspecified control: Secondary | ICD-10-CM

## 2012-06-28 ENCOUNTER — Encounter: Payer: Self-pay | Admitting: *Deleted

## 2012-06-28 ENCOUNTER — Telehealth: Payer: Self-pay | Admitting: Obstetrics and Gynecology

## 2012-06-28 NOTE — Progress Notes (Signed)
  Patient was seen on 06/27/12 for Gestational Diabetes self-management class at [facility]. The following learning objectives were met by the patient during this course:   States the definition of Gestational Diabetes  States why dietary management is important in controlling blood glucose  Describes the effects each nutrient has on blood glucose levels  Demonstrates ability to create a balanced meal plan  Demonstrates carbohydrate counting   States when to check blood glucose levels  Demonstrates proper blood glucose monitoring techniques  States the effect of stress and exercise on blood glucose levels  States the importance of limiting caffeine and abstaining from alcohol and smoking  Blood glucose monitor given: Accu Chek Nano BG Monitoring Kit Lot # [id number]  Exp: 08/24/13 Blood glucose reading: 98 mg/dl  Patient instructed to monitor glucose levels: FBS: 60 - <90 2 hour: <120  *Patient received handouts:  Nutrition Diabetes and Pregnancy  Carbohydrate Counting List  Patient will be seen for follow-up as needed.

## 2012-06-28 NOTE — Telephone Encounter (Signed)
Lm on vm for pt to call back.

## 2012-06-28 NOTE — Telephone Encounter (Signed)
Tc to pt, requesting test strips and lancets for Accu-check aviva plus machine. Rx faxed to [facility] in [place] per pt request. Pt notified and voiced understanding.

## 2012-06-28 NOTE — Patient Instructions (Addendum)
Goals:  Check glucose levels per MD as instructed  Follow Gestational Diabetes Diet as instructed  Call for follow-up as needed    

## 2012-07-11 ENCOUNTER — Ambulatory Visit (INDEPENDENT_AMBULATORY_CARE_PROVIDER_SITE_OTHER): Payer: Medicaid Other

## 2012-07-11 ENCOUNTER — Other Ambulatory Visit: Payer: Self-pay | Admitting: Obstetrics and Gynecology

## 2012-07-11 VITALS — BP 118/64 | Wt 228.0 lb

## 2012-07-11 DIAGNOSIS — Z3689 Encounter for other specified antenatal screening: Secondary | ICD-10-CM

## 2012-07-11 DIAGNOSIS — O99810 Abnormal glucose complicating pregnancy: Secondary | ICD-10-CM

## 2012-07-11 DIAGNOSIS — O24419 Gestational diabetes mellitus in pregnancy, unspecified control: Secondary | ICD-10-CM

## 2012-07-11 LAB — US OB FOLLOW UP

## 2012-07-11 NOTE — Progress Notes (Signed)
[redacted]w[redacted]d; rev'd u/s and cbg's (F=83-101; PP=78-170); Pt has been checking cbg's since 06/27/12.  Pt hasn't adjusted diet that much.  Pt is very interested in waterbirth.  Disc'd w/ pt at current trend, will likely need medicine for diabetes, but will allow her to observe for one more week and work on diet more diligently and try to walk daily, and then return to see MD next week to reassess.  F/u anatomy today: all previous cardiac and facial views not well seen, seen today.  BLE's WNL on u/s today.  Post plac. Vtx. EFW=1+3 (53%). AUA=[redacted]w[redacted]d.cx=3.34 cm. AFI nml.

## 2012-07-11 NOTE — Progress Notes (Signed)
Pt stated no issues today.  

## 2012-07-16 ENCOUNTER — Encounter: Payer: Medicaid Other | Admitting: Obstetrics and Gynecology

## 2012-07-16 ENCOUNTER — Telehealth: Payer: Self-pay | Admitting: Obstetrics and Gynecology

## 2012-07-16 NOTE — Telephone Encounter (Signed)
Returned pt's call. Pt not  There at the present time.  LM with female to return call.

## 2012-07-19 ENCOUNTER — Inpatient Hospital Stay (HOSPITAL_COMMUNITY)
Admission: AD | Admit: 2012-07-19 | Discharge: 2012-07-19 | Disposition: A | Discharge status: Home / Self Care | Payer: Medicaid Other | Source: Ambulatory Visit | Attending: Obstetrics and Gynecology | Admitting: Obstetrics and Gynecology

## 2012-07-19 ENCOUNTER — Telehealth: Payer: Self-pay | Admitting: Obstetrics and Gynecology

## 2012-07-19 ENCOUNTER — Telehealth: Payer: Self-pay

## 2012-07-19 ENCOUNTER — Encounter (HOSPITAL_COMMUNITY): Payer: Self-pay | Admitting: *Deleted

## 2012-07-19 ENCOUNTER — Other Ambulatory Visit: Payer: Self-pay | Admitting: Obstetrics and Gynecology

## 2012-07-19 DIAGNOSIS — M899 Disorder of bone, unspecified: Secondary | ICD-10-CM

## 2012-07-19 DIAGNOSIS — O24419 Gestational diabetes mellitus in pregnancy, unspecified control: Secondary | ICD-10-CM

## 2012-07-19 DIAGNOSIS — O99891 Other specified diseases and conditions complicating pregnancy: Secondary | ICD-10-CM | POA: Insufficient documentation

## 2012-07-19 DIAGNOSIS — M545 Low back pain, unspecified: Secondary | ICD-10-CM | POA: Insufficient documentation

## 2012-07-19 DIAGNOSIS — J069 Acute upper respiratory infection, unspecified: Secondary | ICD-10-CM

## 2012-07-19 DIAGNOSIS — N200 Calculus of kidney: Secondary | ICD-10-CM

## 2012-07-19 DIAGNOSIS — Z889 Allergy status to unspecified drugs, medicaments and biological substances status: Secondary | ICD-10-CM

## 2012-07-19 DIAGNOSIS — O99810 Abnormal glucose complicating pregnancy: Secondary | ICD-10-CM | POA: Insufficient documentation

## 2012-07-19 DIAGNOSIS — R059 Cough, unspecified: Secondary | ICD-10-CM | POA: Insufficient documentation

## 2012-07-19 DIAGNOSIS — O9989 Other specified diseases and conditions complicating pregnancy, childbirth and the puerperium: Secondary | ICD-10-CM

## 2012-07-19 DIAGNOSIS — M5432 Sciatica, left side: Secondary | ICD-10-CM

## 2012-07-19 DIAGNOSIS — M259 Joint disorder, unspecified: Secondary | ICD-10-CM

## 2012-07-19 DIAGNOSIS — H6690 Otitis media, unspecified, unspecified ear: Secondary | ICD-10-CM

## 2012-07-19 DIAGNOSIS — R05 Cough: Secondary | ICD-10-CM | POA: Insufficient documentation

## 2012-07-19 LAB — URINALYSIS, ROUTINE W REFLEX MICROSCOPIC
Glucose, UA: NEGATIVE mg/dL
Ketones, ur: NEGATIVE mg/dL
Nitrite: NEGATIVE
Protein, ur: NEGATIVE mg/dL
pH: 6.5 (ref 5.0–8.0)

## 2012-07-19 LAB — URINE MICROSCOPIC-ADD ON

## 2012-07-19 MED ORDER — OXYCODONE-ACETAMINOPHEN 2.5-325 MG PO TABS: 1.0000 | ORAL_TABLET | ORAL | Status: DC | PRN

## 2012-07-19 MED ORDER — GUAIFENESIN-DM 100-10 MG/5ML PO SYRP: 5.0000 mL | ORAL_SOLUTION | Freq: Three times a day (TID) | ORAL | Status: DC | PRN

## 2012-07-19 NOTE — Progress Notes (Signed)
History  Megan Castaneda is a 26 y.o. G2P1001 at [redacted]w[redacted]d   Chief Complaint  Patient presents with  . Cough  . Back Pain  had a productive cough since 12-24 that makes her vomit from coughing. No fever, chills, body aches, no exposure to anyone with flu. Back is a little better with heat. Started having low back pain that hurts down to thigh without relief since yesterday. Denies bleeding, uc, or leaking and has felt the baby move.     [redacted]w[redacted]d   Chief Complaint  Patient presents with  . Cough  . Back Pain   @SFHPI @  Prior to Admission medications   Medication Sig Start Date End Date Taking? Authorizing Provider  acetaminophen (TYLENOL) 500 MG tablet Take 1,000 mg by mouth every 6 (six) hours as needed. For pain   Yes Historical Provider, MD  cyclobenzaprine (FLEXERIL) 10 MG tablet Take 1 tablet (10 mg total) by mouth 3 (three) times daily as needed for muscle spasms. 05/11/12  Yes Nigel Bridgeman, CNM  docusate sodium (COLACE) 100 MG capsule Take 200 mg by mouth daily. For bm   Yes Historical Provider, MD  Prenat w/o A-FeCbn-DSS-FA-DHA (CITRANATAL HARMONY) 27-1-250 MG CAPS Take 1 tablet by mouth daily. 05/11/12  Yes Nigel Bridgeman, CNM  promethazine (PHENERGAN) 25 MG tablet Take 1 tablet po q 6 hours prn nausea 06/14/12  Yes Nigel Bridgeman, CNM  ranitidine (ZANTAC) 150 MG tablet Take 300 mg by mouth 2 (two) times daily as needed. For acid reflux   Yes Historical Provider, MD  guaiFENesin-dextromethorphan (ROBITUSSIN DM) 100-10 MG/5ML syrup Take 5 mLs by mouth 3 (three) times daily as needed for cough. 07/19/12   Lavera Guise, CNM  oxycodone-acetaminophen (PERCOCET) 2.5-325 MG per tablet Take 1 tablet by mouth every 4 (four) hours as needed for pain. 07/19/12   Lavera Guise, CNM    Patient Active Problem List  Diagnosis  . Pregnant state, incidental  . Obesity  . Anxiety  . Kidney stone  . Ear infection  . Multiple drug allergies--ibuprophen, amoxicillin, sulfa  . Gestational diabetes     Vitals:  Blood pressure 117/73, pulse 112, temperature 98.1 F (36.7 C), temperature source Oral, resp. rate 18, height 5\' 1"  (1.549 m), weight 227 lb 12.8 oz (103.329 kg), last menstrual period 01/18/2012, SpO2 99.00%. OB History    Grav Para Term Preterm Abortions TAB SAB Ect Mult Living   2 1 1       1       Past Medical History  Diagnosis Date  . Complication of anesthesia 2011    Has ongoing shoulder and back pain from epidural  . Asthma     triggered by dust;colds;has inhaler prn  . Infection     Yeast;not frequent  . Infection     UTI;not frequent  . Chlamydia 2006    was treated  . Gonorrhea 2006    was treated  . PID (acute pelvic inflammatory disease) 2006    Antibxs;found out after + GC/CT  . Bipolar 1 disorder     Meds in the past  . Anxiety     Meds in the past  . Fibromyalgia   . Diabetes mellitus without complication     Past Surgical History  Procedure Date  . Appendectomy 05/2008  . Wisdom tooth extraction 1999    All 4 removed    Family History  Problem Relation Age of Onset  . Thyroid disease Mother   . Cancer Mother  Gallbladder  . Depression Mother   . Anxiety disorder Mother   . Fibromyalgia Mother   . Diabetes Maternal Grandmother   . Bipolar disorder Maternal Grandmother   . Depression Maternal Grandmother   . Anxiety disorder Maternal Grandmother   . Fibromyalgia Maternal Grandmother   . Diabetes Maternal Grandfather   . Dementia Paternal Grandmother   . Dementia Paternal Grandfather   . Other Neg Hx     History  Substance Use Topics  . Smoking status: Former Smoker    Types: Cigarettes    Quit date: 07/25/2010  . Smokeless tobacco: Never Used  . Alcohol Use: No    Allergies:  Allergies  Allergen Reactions  . Motrin (Ibuprofen) Other (See Comments)    Stomach pain  . Amoxicillin Rash  . Sulfa Antibiotics Rash    Prescriptions prior to admission  Medication Sig Dispense Refill  . acetaminophen (TYLENOL) 500 MG  tablet Take 1,000 mg by mouth every 6 (six) hours as needed. For pain      . cyclobenzaprine (FLEXERIL) 10 MG tablet Take 1 tablet (10 mg total) by mouth 3 (three) times daily as needed for muscle spasms.  30 tablet  2  . docusate sodium (COLACE) 100 MG capsule Take 200 mg by mouth daily. For bm      . Prenat w/o A-FeCbn-DSS-FA-DHA (CITRANATAL HARMONY) 27-1-250 MG CAPS Take 1 tablet by mouth daily.  30 capsule  11  . promethazine (PHENERGAN) 25 MG tablet Take 1 tablet po q 6 hours prn nausea  36 tablet  2  . ranitidine (ZANTAC) 150 MG tablet Take 300 mg by mouth 2 (two) times daily as needed. For acid reflux        @ROS @ Physical Exam   Blood pressure 117/73, pulse 112, temperature 98.1 F (36.7 C), temperature source Oral, resp. rate 18, height 5\' 1"  (1.549 m), weight 227 lb 12.8 oz (103.329 kg), last menstrual period 01/18/2012, SpO2 99.00%.  @PHYSEXAMBYAGE2 @ Labs:  Recent Results (from the past 24 hour(s))  URINALYSIS, ROUTINE W REFLEX MICROSCOPIC   Collection Time   07/19/12 12:25 PM      Component Value Range   Color, Urine YELLOW  YELLOW   APPearance CLEAR  CLEAR   Specific Gravity, Urine 1.020  1.005 - 1.030   pH 6.5  5.0 - 8.0   Glucose, UA NEGATIVE  NEGATIVE mg/dL   Hgb urine dipstick NEGATIVE  NEGATIVE   Bilirubin Urine NEGATIVE  NEGATIVE   Ketones, ur NEGATIVE  NEGATIVE mg/dL   Protein, ur NEGATIVE  NEGATIVE mg/dL   Urobilinogen, UA 0.2  0.0 - 1.0 mg/dL   Nitrite NEGATIVE  NEGATIVE   Leukocytes, UA SMALL (*) NEGATIVE  URINE MICROSCOPIC-ADD ON   Collection Time   07/19/12 12:25 PM      Component Value Range   Squamous Epithelial / LPF MANY (*) RARE   WBC, UA 0-2  <3 WBC/hpf   Urine-Other MUCOUS PRESENT     ASSESSMENT: Patient Active Problem List  Diagnosis  . Pregnant state, incidental  . Obesity  . Anxiety  . Kidney stone  . Ear infection  . Multiple drug allergies--ibuprophen, amoxicillin, sulfa  . Gestational diabetes   Physical Examination: Physical  exam: Calm, no distress, HEENT wnl with exception with edematous nasal turbinates lungs clear bilaterally, AP RRR, abd soft, gravid, nt, bowel sounds active, abdomen nontender, no edema lower legs. PPP bilaterally FHTS + UC UA neg O2 sat neg  ED Course  Assessment/Plan [redacted]w[redacted]d IUP Sciatica L  leg URI RX robitussin, percocet, use of felexeril x 5 days, heat, ice, plan PT office to call for appt Call [facility] Fri 1200 if not contacted. Lavera Guise, CNM

## 2012-07-19 NOTE — Telephone Encounter (Signed)
Pt scheduled with ND on 07/26/12.

## 2012-07-19 NOTE — MAU Note (Signed)
Patient states she had had a productive cough since 12-24 that makes her vomit from coughing. Started having low back pain since yesterday. Denies bleeding or leaking and has felt the baby move.

## 2012-07-19 NOTE — Telephone Encounter (Signed)
Tc to pt c/o pain on the left side of her lower back radiating down to her left leg. Pt states that she has tried taking tylenol, flexeril, heating pads, and warm bath but it does not seem to alleviate the pain. Advised pt to be seen at [facility] for evaluation per [facility]. Pt voiced understanding.

## 2012-07-19 NOTE — Telephone Encounter (Signed)
Low dosage percocet 2/5/325 will disregard and RX vicodin called to pt pharmacy discussed. Lavera Guise, CNM

## 2012-07-26 ENCOUNTER — Encounter: Payer: Self-pay | Admitting: Obstetrics and Gynecology

## 2012-07-26 ENCOUNTER — Ambulatory Visit (INDEPENDENT_AMBULATORY_CARE_PROVIDER_SITE_OTHER): Payer: Medicaid Other | Admitting: Obstetrics and Gynecology

## 2012-07-26 VITALS — BP 104/60 | Wt 230.0 lb

## 2012-07-26 DIAGNOSIS — O24419 Gestational diabetes mellitus in pregnancy, unspecified control: Secondary | ICD-10-CM

## 2012-07-26 DIAGNOSIS — O99810 Abnormal glucose complicating pregnancy: Secondary | ICD-10-CM

## 2012-07-26 MED ORDER — GLYBURIDE 2.5 MG PO TABS: 5.0000 mg | ORAL_TABLET | Freq: Every day | ORAL | Status: DC

## 2012-07-26 NOTE — Progress Notes (Signed)
[redacted]w[redacted]d FBS 88-111 2hrpp 95-146 Will start glyburide US@[place]  for growth

## 2012-07-26 NOTE — Patient Instructions (Signed)

## 2012-07-27 ENCOUNTER — Telehealth: Payer: Self-pay | Admitting: Obstetrics and Gynecology

## 2012-07-27 NOTE — Telephone Encounter (Signed)
Tc to pt per telephone call. Informed pt Glyburide e-pres to [facility] Pt also req rf for Flexeril. Will consult with ND per req and cb. Pt agrees.

## 2012-07-30 ENCOUNTER — Telehealth: Payer: Self-pay | Admitting: Obstetrics and Gynecology

## 2012-07-30 MED ORDER — CYCLOBENZAPRINE HCL 10 MG PO TABS: 10.0000 mg | ORAL_TABLET | Freq: Three times a day (TID) | ORAL | Status: DC | PRN

## 2012-07-30 NOTE — Telephone Encounter (Signed)
Flexeril was re ordered

## 2012-07-30 NOTE — Telephone Encounter (Signed)
LM for pt to return call  Rasheema Truluck, CMA  

## 2012-07-31 ENCOUNTER — Other Ambulatory Visit: Payer: Self-pay | Admitting: Obstetrics and Gynecology

## 2012-07-31 NOTE — Telephone Encounter (Signed)
Spoke with pt rgd msg pt wants refill on vicoden advised pt need eval pt has appt 08/03/12 will ask for refill at that time

## 2012-07-31 NOTE — Telephone Encounter (Signed)
Tc to pt. Rx for Flexeril e-pres to pharm on file. Pt aware.

## 2012-08-01 ENCOUNTER — Ambulatory Visit: Payer: Medicaid Other | Admitting: Physical Therapy

## 2012-08-03 ENCOUNTER — Telehealth: Payer: Self-pay | Admitting: *Deleted

## 2012-08-03 ENCOUNTER — Ambulatory Visit: Payer: Medicaid Other | Admitting: *Deleted

## 2012-08-03 ENCOUNTER — Encounter: Payer: Self-pay | Admitting: *Deleted

## 2012-08-03 ENCOUNTER — Ambulatory Visit (INDEPENDENT_AMBULATORY_CARE_PROVIDER_SITE_OTHER): Payer: Medicaid Other

## 2012-08-03 VITALS — BP 100/62 | Wt 234.0 lb

## 2012-08-03 DIAGNOSIS — O24419 Gestational diabetes mellitus in pregnancy, unspecified control: Secondary | ICD-10-CM

## 2012-08-03 DIAGNOSIS — O99810 Abnormal glucose complicating pregnancy: Secondary | ICD-10-CM

## 2012-08-03 DIAGNOSIS — Z331 Pregnant state, incidental: Secondary | ICD-10-CM

## 2012-08-03 LAB — US OB FOLLOW UP

## 2012-08-03 NOTE — Telephone Encounter (Signed)
Pt seen today

## 2012-08-03 NOTE — Progress Notes (Signed)
Continues glyburide Fasting BG 70-98 2hr pp 95-148 with one 180. Stated that she had eaten a [employer] Peanut butter cup prior to finger stick. Counseled concerning being fastidious with diet.  Back pain still an issue but has not visited PT.  Encourage to make and keep PT appt. Comfort measures reviewed.

## 2012-08-03 NOTE — Progress Notes (Signed)
[redacted]w[redacted]d C/o restless leg syndrome at noc no relief with Flexeril C/o low back pain radiating down to L leg Pt has CBG's today. Ultrasound shows:  [place] EDD: 11/11/12           EFW: 66.3%           Cervical length: 5.00 cm                      Fluid is normal vertical pocket = 5.4 cm           Placenta localization: posterior           Fetal presentation: vertex

## 2012-08-03 NOTE — Telephone Encounter (Signed)
Tc to pt per telephone call. Flexeril printed on 07/26/12 vs e-pres. Rx for Flexeril on file e-pres to pharm on file. Pt has already filled Glyburide and has 6 rf's available. Pt voices understanding.

## 2012-08-13 ENCOUNTER — Other Ambulatory Visit: Payer: Medicaid Other

## 2012-08-13 ENCOUNTER — Ambulatory Visit: Payer: Medicaid Other | Admitting: Obstetrics and Gynecology

## 2012-08-13 VITALS — BP 104/68 | Wt 237.6 lb

## 2012-08-13 DIAGNOSIS — O24419 Gestational diabetes mellitus in pregnancy, unspecified control: Secondary | ICD-10-CM

## 2012-08-13 NOTE — Progress Notes (Signed)
Pt stated been having some back pain  Pt stated no other issues today.

## 2012-08-13 NOTE — Progress Notes (Signed)
Pt doing well Discussed BG brought from home FBS 71-110 2 hr PP dinner 117-157 22 hr PP otherwise 106-150 Consulted with SR Take 5mg  Glyburide 30 min before dinner, Monitor diet May require insulin as pregnancy progresses Instructed to call on Friday with BGs Keep sch appt 2/7 for [place] and ROB

## 2012-08-14 ENCOUNTER — Ambulatory Visit: Payer: Medicaid Other | Attending: Obstetrics and Gynecology

## 2012-08-14 DIAGNOSIS — M5430 Sciatica, unspecified side: Secondary | ICD-10-CM | POA: Insufficient documentation

## 2012-08-14 DIAGNOSIS — IMO0001 Reserved for inherently not codable concepts without codable children: Secondary | ICD-10-CM | POA: Insufficient documentation

## 2012-08-14 DIAGNOSIS — M545 Low back pain, unspecified: Secondary | ICD-10-CM | POA: Insufficient documentation

## 2012-08-14 DIAGNOSIS — M2569 Stiffness of other specified joint, not elsewhere classified: Secondary | ICD-10-CM | POA: Insufficient documentation

## 2012-08-14 DIAGNOSIS — R5381 Other malaise: Secondary | ICD-10-CM | POA: Insufficient documentation

## 2012-08-14 DIAGNOSIS — M79609 Pain in unspecified limb: Secondary | ICD-10-CM | POA: Insufficient documentation

## 2012-08-14 DIAGNOSIS — O99891 Other specified diseases and conditions complicating pregnancy: Secondary | ICD-10-CM | POA: Insufficient documentation

## 2012-08-20 ENCOUNTER — Telehealth: Payer: Self-pay | Admitting: Obstetrics and Gynecology

## 2012-08-20 ENCOUNTER — Encounter: Payer: Self-pay | Admitting: Obstetrics and Gynecology

## 2012-08-20 NOTE — Telephone Encounter (Signed)
I would recommend increasing Glyburide to BID: 30 minutes before breakfast and 30 minutes before dinner

## 2012-08-20 NOTE — Telephone Encounter (Signed)
Spoke with pt rgd msg pt states seen on 08/13/12 was told to call and give cbgs  08/14/12 Fasting 95,100,142,187 08/15/12 Fasting 88,107,132,134,92 08/16/12 Fasting 82, 161 pt busy this day only checked cbgs twice 08/17/12 @1 :15 176 the evening 113 08/18/12 Fasting 88, evening 93 08/19/12 fasting 67, after dinner 113 07/2712 fasting 99, 9:59 after eating peanut butter and jelly sandwich 219, 11:30 156  Advised pt will forward cbgs to VL and will call back with any recommendations pt voice understanding

## 2012-08-20 NOTE — Telephone Encounter (Signed)
See note from SR. Please call patient and advise of change in Glyburide to 5 mg BID, 30 min before breakfast and 30 min before dinner. Check if she needs refill on med, since we are increasing the frequency of dosing. Thanks! VL

## 2012-08-20 NOTE — Telephone Encounter (Signed)
SR--see CBGs.  Per your instructions, she is taking Glyburide 5 mg po 30 min before dinner. CBGs still frequently out of range. Please advise--insulin?  VL

## 2012-08-21 ENCOUNTER — Other Ambulatory Visit: Payer: Self-pay

## 2012-08-21 ENCOUNTER — Ambulatory Visit: Payer: Medicaid Other | Admitting: Physical Therapy

## 2012-08-21 ENCOUNTER — Telehealth: Payer: Self-pay

## 2012-08-21 MED ORDER — GLYBURIDE 2.5 MG PO TABS: ORAL_TABLET | ORAL | Status: DC

## 2012-08-21 NOTE — Telephone Encounter (Signed)
Tc to pt rgd CBG's. Per SR, pt needs to take Glyburide 5mg  bid, 30 min before breakfast and 30 min before dinner. Advised pt of instructions, pt agreeable. Sent refills to pt pharmacy in order to accommodate for increase in rx.

## 2012-08-22 ENCOUNTER — Ambulatory Visit: Payer: Medicaid Other | Admitting: Obstetrics and Gynecology

## 2012-08-22 ENCOUNTER — Ambulatory Visit: Payer: Medicaid Other

## 2012-08-22 ENCOUNTER — Telehealth: Payer: Self-pay | Admitting: Obstetrics and Gynecology

## 2012-08-22 VITALS — BP 118/66 | Wt 235.0 lb

## 2012-08-22 DIAGNOSIS — O288 Other abnormal findings on antenatal screening of mother: Secondary | ICD-10-CM

## 2012-08-22 DIAGNOSIS — O26899 Other specified pregnancy related conditions, unspecified trimester: Secondary | ICD-10-CM

## 2012-08-22 LAB — POCT URINALYSIS DIPSTICK
Glucose, UA: NEGATIVE
Leukocytes, UA: NEGATIVE
Nitrite, UA: NEGATIVE
Spec Grav, UA: 1.02
Urobilinogen, UA: NEGATIVE

## 2012-08-22 LAB — POCT WET PREP (WET MOUNT)
Whiff Test: NEGATIVE
pH: 4.5

## 2012-08-22 LAB — FETAL FIBRONECTIN: Fetal Fibronectin: NEGATIVE

## 2012-08-22 NOTE — Progress Notes (Signed)
[redacted]w[redacted]d Worked in for above complaints Reports low abdominal pressure and some uterine activity. No LOF. No bleeding. GFM. Saw PT yesterday for chronic back pain NST NR: BPP 8/8 FFN done will call pt with results. Suspect muscular pain: recommend maternity belt.  Wet prep -   Chem 10 normal

## 2012-08-22 NOTE — Progress Notes (Signed)
[redacted]w[redacted]d Pt has had whitish  leakage since 2 days and lots of right sided pressure  Pt unable to void

## 2012-08-22 NOTE — Progress Notes (Signed)
Ultrasound shows:  Gestational age by [place]     SIUP  S=D     [place] EDD: 11/11/2012            AFI: 18.3 cm             Cervical length: Not Measured             Placenta localization: posterior fundal            Fetal presentation: vertex          Comments: Normal fluid : AFI of 18.3 cm = 70th%tile.    Score BPP 8 of 8 in 7 mins    CX not measured

## 2012-08-22 NOTE — Telephone Encounter (Signed)
TC to pt.   States is having vaginal and pelvic since last PM.  Abd pain that comes and goes.  Took Flexeril and Tylenol but sx persist.  States +FM>  Having clear watery vaginal D/C.  Sched for eval with Dr SR today.

## 2012-08-23 ENCOUNTER — Other Ambulatory Visit: Payer: Self-pay | Admitting: Obstetrics and Gynecology

## 2012-08-23 DIAGNOSIS — O288 Other abnormal findings on antenatal screening of mother: Secondary | ICD-10-CM

## 2012-08-30 ENCOUNTER — Other Ambulatory Visit (HOSPITAL_COMMUNITY): Payer: Self-pay | Admitting: Obstetrics and Gynecology

## 2012-08-31 ENCOUNTER — Ambulatory Visit: Payer: Medicaid Other | Admitting: Obstetrics and Gynecology

## 2012-08-31 ENCOUNTER — Encounter: Payer: Medicaid Other | Admitting: Obstetrics and Gynecology

## 2012-08-31 ENCOUNTER — Ambulatory Visit: Payer: Medicaid Other

## 2012-08-31 ENCOUNTER — Other Ambulatory Visit: Payer: Medicaid Other

## 2012-08-31 ENCOUNTER — Encounter: Payer: Self-pay | Admitting: Obstetrics and Gynecology

## 2012-08-31 VITALS — BP 120/80 | Wt 237.0 lb

## 2012-08-31 DIAGNOSIS — O24419 Gestational diabetes mellitus in pregnancy, unspecified control: Secondary | ICD-10-CM

## 2012-08-31 DIAGNOSIS — N898 Other specified noninflammatory disorders of vagina: Secondary | ICD-10-CM

## 2012-08-31 DIAGNOSIS — R8290 Unspecified abnormal findings in urine: Secondary | ICD-10-CM

## 2012-08-31 LAB — POCT URINALYSIS DIPSTICK
Glucose, UA: NEGATIVE
Leukocytes, UA: NEGATIVE
Spec Grav, UA: 1.02

## 2012-08-31 LAB — POCT WET PREP (WET MOUNT): PH, VAGINAL: 4

## 2012-08-31 MED ORDER — INSULIN NPH (HUMAN) (ISOPHANE) 100 UNIT/ML ~~LOC~~ SUSP: 10.0000 [IU] | Freq: Every day | SUBCUTANEOUS | Status: DC

## 2012-08-31 MED ORDER — GLYBURIDE 5 MG PO TABS: ORAL_TABLET | ORAL | Status: DC

## 2012-08-31 MED ORDER — INSULIN NPH (HUMAN) (ISOPHANE) 100 UNIT/ML ~~LOC~~ SUSP: 5.0000 [IU] | Freq: Every day | SUBCUTANEOUS | Status: DC

## 2012-08-31 NOTE — Progress Notes (Signed)
[redacted]w[redacted]d [place] ewf 4-6 88% AFI 17.4 cx 3.61 cm  vtx post placenta GDM pt reports all FBS greater than 100 and 2 hr pp 150- 200 Start insulin NPH 10 q am and 5 QHS.   Pt will return for insulin teaching.  Wet prep is negative Send urine for culture Start BPP q week or NST twice weekly at 32 weeks contiue glyburide 10 mg BID until pt starts insulin then stop glyburide

## 2012-08-31 NOTE — Progress Notes (Signed)
Pt c/o odor with urine.

## 2012-09-02 LAB — CULTURE, OB URINE

## 2012-09-03 ENCOUNTER — Other Ambulatory Visit: Payer: Medicaid Other

## 2012-09-03 NOTE — Progress Notes (Unsigned)
Pt presented for insulin teaching.  Written instructions given and reviewed. Demonstration given and pt practiced.  Pt then self injected first dose of insulin. Reviewed dosage. Discussed signs of low blood sugar and treatment.  Discussed importance of continuing to monitor blood sugar. Pt states is having difficulty maintaining diet due to lack of healthy food choices. Franchot Erichsen contacted for F/U. To keep appt 09/07/12/

## 2012-09-04 ENCOUNTER — Ambulatory Visit: Payer: Medicaid Other | Admitting: Physical Therapy

## 2012-09-06 ENCOUNTER — Other Ambulatory Visit: Payer: Self-pay | Admitting: Obstetrics and Gynecology

## 2012-09-06 NOTE — Telephone Encounter (Signed)
AccuCheck Aviva test strips, 200, 2 refills, called to [facility] in [place], [place].  Dr. Stefano Gaul

## 2012-09-07 ENCOUNTER — Encounter: Payer: Medicaid Other | Admitting: Obstetrics and Gynecology

## 2012-09-10 LAB — US OB FOLLOW UP

## 2012-09-12 ENCOUNTER — Other Ambulatory Visit: Payer: Self-pay | Admitting: Obstetrics and Gynecology

## 2012-09-14 ENCOUNTER — Ambulatory Visit: Payer: Medicaid Other | Admitting: Obstetrics and Gynecology

## 2012-09-14 ENCOUNTER — Encounter: Payer: Self-pay | Admitting: Obstetrics and Gynecology

## 2012-09-14 VITALS — BP 108/70 | Wt 241.0 lb

## 2012-09-14 DIAGNOSIS — O24419 Gestational diabetes mellitus in pregnancy, unspecified control: Secondary | ICD-10-CM

## 2012-09-14 DIAGNOSIS — Z331 Pregnant state, incidental: Secondary | ICD-10-CM

## 2012-09-14 MED ORDER — CYCLOBENZAPRINE HCL 10 MG PO TABS: 10.0000 mg | ORAL_TABLET | Freq: Three times a day (TID) | ORAL | Status: DC | PRN

## 2012-09-14 NOTE — Addendum Note (Signed)
Addended by: Osborn Coho on: 09/14/2012 11:25 AM   Modules accepted: Orders

## 2012-09-14 NOTE — Addendum Note (Signed)
Addended by: Marla Roe A on: 09/14/2012 10:33 AM   Modules accepted: Orders

## 2012-09-14 NOTE — Progress Notes (Addendum)
[redacted]w[redacted]d S>D will recheck EFW in another 3wks FKCs May use prep H CBGs are increased Inc insulin to 12u NPH in am and 8u at night (from 10 and 5) BPP q wk and EFW q4wks, plan induction at 39 or 40wks Pt requests refill of flexeril

## 2012-09-17 ENCOUNTER — Other Ambulatory Visit: Payer: Self-pay

## 2012-09-17 ENCOUNTER — Telehealth: Payer: Self-pay | Admitting: Obstetrics and Gynecology

## 2012-09-17 DIAGNOSIS — O99810 Abnormal glucose complicating pregnancy: Secondary | ICD-10-CM

## 2012-09-17 MED ORDER — CYCLOBENZAPRINE HCL 10 MG PO TABS: 10.0000 mg | ORAL_TABLET | Freq: Three times a day (TID) | ORAL | Status: DC | PRN

## 2012-09-17 NOTE — Telephone Encounter (Signed)
Pt was to have Rf for Flexeril.  Not ordered.   [facility].  Pt [phone]

## 2012-09-17 NOTE — Telephone Encounter (Signed)
Spoke with pt rg msg informed rx sent to pharm on Friday can pick up rx pt [facility] advised pt will send rx to correct pharmacy pt voice understanding

## 2012-09-18 ENCOUNTER — Encounter: Payer: Medicaid Other | Admitting: Physical Therapy

## 2012-09-20 ENCOUNTER — Telehealth: Payer: Self-pay | Admitting: Obstetrics and Gynecology

## 2012-09-20 NOTE — Telephone Encounter (Signed)
TC from pt. States for 10 min. Has been having contractions about 3 every 5 min.  + back pain.Did not take insulin this AM but is having nausea. Had 32+ oz water. Decreased FM this am but moved as normal during the night. Per CHS, advised pt to go home, check sugar, eat and take warm bath x 30 min. To monitor contractions and FM. To call if cont increase. To call in one hour with update of cont and FM. When explained to pt states just ate salad and cheeseburger. Is agreeable with plan and Pt verbalizes comprehension.

## 2012-09-20 NOTE — Telephone Encounter (Signed)
TC to pt x 2. Pt answered 2nd call. States just arrived home and is lying down.Had 4 cont in last hour. Took Flexeril for back pain. Baby was very active. Blood sugar was 168. ADvised increased water. Continue to monitor FM and cont. To call if FM decreased or having cont 4 or more x /hr or with any other concerns. Pt verbalizes comprehension.  Info reviewed and Plan OK's by CHS.

## 2012-09-21 ENCOUNTER — Inpatient Hospital Stay (HOSPITAL_COMMUNITY): Payer: Medicaid Other

## 2012-09-21 ENCOUNTER — Encounter: Payer: Medicaid Other | Admitting: Certified Nurse Midwife

## 2012-09-21 ENCOUNTER — Encounter (HOSPITAL_COMMUNITY): Payer: Self-pay | Admitting: *Deleted

## 2012-09-21 ENCOUNTER — Encounter: Payer: Medicaid Other | Admitting: Family Medicine

## 2012-09-21 ENCOUNTER — Inpatient Hospital Stay (HOSPITAL_COMMUNITY)
Admission: AD | Admit: 2012-09-21 | Discharge: 2012-09-21 | Disposition: A | Discharge status: Home / Self Care | Payer: Medicaid Other | Source: Ambulatory Visit | Attending: Obstetrics and Gynecology | Admitting: Obstetrics and Gynecology

## 2012-09-21 ENCOUNTER — Other Ambulatory Visit: Payer: Medicaid Other

## 2012-09-21 DIAGNOSIS — O99810 Abnormal glucose complicating pregnancy: Secondary | ICD-10-CM | POA: Insufficient documentation

## 2012-09-21 DIAGNOSIS — O4700 False labor before 37 completed weeks of gestation, unspecified trimester: Secondary | ICD-10-CM | POA: Insufficient documentation

## 2012-09-21 LAB — URINALYSIS, ROUTINE W REFLEX MICROSCOPIC
Glucose, UA: NEGATIVE mg/dL
Ketones, ur: 40 mg/dL — AB
Leukocytes, UA: NEGATIVE
Specific Gravity, Urine: 1.02 (ref 1.005–1.030)
pH: 6 (ref 5.0–8.0)

## 2012-09-21 LAB — WET PREP, GENITAL
Trich, Wet Prep: NONE SEEN
Yeast Wet Prep HPF POC: NONE SEEN

## 2012-09-21 MED ORDER — METRONIDAZOLE 500 MG PO TABS: 500.0000 mg | ORAL_TABLET | Freq: Two times a day (BID) | ORAL | Status: DC

## 2012-09-21 NOTE — MAU Note (Signed)
Started contracting yesterday, have been off and on. Lot of pelvic pressure this morning, continues to contract- getting closer and stronger q 2-3.

## 2012-09-21 NOTE — MAU Note (Signed)
Back from [place], 8/8 on BPP.    CNM Lillard notified.

## 2012-09-21 NOTE — MAU Provider Note (Signed)
History     CSN: [id number]  Arrival date and time: 09/21/12 1013   None     Chief Complaint  Patient presents with  . Labor Eval   HPI Comments: Pt is a G2P1 at [redacted]w[redacted]d that arrived unannounced w CC of ctx and pelvic pressure and discharge. She states she's been having the ctx since last night, but couldn't come to be evaluated because she had no child care. She denies any vag bleeding, or LOF, intercourse 2-3 days ago.  Pt is an IDGDM, states she did not take her morning insulin today, fasting CBG this am was 118, last ate about 10am. Scheduled for growth [place] today at the office.       Past Medical History  Diagnosis Date  . Complication of anesthesia 2011    Has ongoing shoulder and back pain from epidural  . Asthma     triggered by dust;colds;has inhaler prn  . Infection     Yeast;not frequent  . Infection     UTI;not frequent  . Chlamydia 2006    was treated  . Gonorrhea 2006    was treated  . PID (acute pelvic inflammatory disease) 2006    Antibxs;found out after + GC/CT  . Bipolar 1 disorder     Meds in the past  . Anxiety     Meds in the past  . Fibromyalgia   . Diabetes mellitus without complication     Past Surgical History  Procedure Laterality Date  . Appendectomy  05/2008  . Wisdom tooth extraction  1999    All 4 removed    Family History  Problem Relation Age of Onset  . Thyroid disease Mother   . Cancer Mother     Gallbladder  . Depression Mother   . Anxiety disorder Mother   . Fibromyalgia Mother   . Diabetes Maternal Grandmother   . Bipolar disorder Maternal Grandmother   . Depression Maternal Grandmother   . Anxiety disorder Maternal Grandmother   . Fibromyalgia Maternal Grandmother   . Diabetes Maternal Grandfather   . Dementia Paternal Grandmother   . Dementia Paternal Grandfather   . Other Neg Hx     History  Substance Use Topics  . Smoking status: Former Smoker    Types: Cigarettes    Quit date: 07/25/2010  . Smokeless  tobacco: Never Used  . Alcohol Use: No    Allergies:  Allergies  Allergen Reactions  . Motrin (Ibuprofen) Other (See Comments)    Stomach pain  . Amoxicillin Rash  . Sulfa Antibiotics Rash    Prescriptions prior to admission  Medication Sig Dispense Refill  . acetaminophen (TYLENOL) 500 MG tablet Take 1,000 mg by mouth every 6 (six) hours as needed. For pain      . benzocaine-resorcinol (VAGISIL) 5-2 % vaginal cream Place 1 application vaginally 3 (three) times daily as needed for itching.      . cyclobenzaprine (FLEXERIL) 10 MG tablet Take 1 tablet (10 mg total) by mouth 3 (three) times daily as needed for muscle spasms.  30 tablet  2  . docusate sodium (COLACE) 100 MG capsule Take 200 mg by mouth daily. For bm      . glyBURIDE (DIABETA) 5 MG tablet Take 2 tablets by mouth bid  30 tablet  6  . Hydrocodone-Acetaminophen (VICODIN ES) 7.5-300 MG TABS Take 1 tablet by mouth 2 (two) times daily as needed. For pain      . insulin NPH (HUMULIN N,NOVOLIN N) 100 UNIT/ML  injection Inject 12 Units into the skin daily before breakfast.      . insulin NPH (HUMULIN N,NOVOLIN N) 100 UNIT/ML injection Inject 8 Units into the skin at bedtime.      Marland Kitchen OVER THE COUNTER MEDICATION Place 1 application rectally 3 (three) times daily as needed. Pt uses an OTC hemorrhoid oint. For swelling and itch..      . OVER THE COUNTER MEDICATION Place 1 suppository rectally once. Pt used an OTC hemorrhoid suppository one time      . Prenatal Vit-Fe Fumarate-FA (PRENATAL MULTIVITAMIN) TABS Take 1 tablet by mouth daily at 12 noon.      . promethazine (PHENERGAN) 25 MG tablet Take 1 tablet po q 6 hours prn nausea  36 tablet  2  . ranitidine (ZANTAC) 300 MG tablet TAKE ONE TABLET BY MOUTH AT BEDTIME  30 tablet  1    Review of Systems  Gastrointestinal: Positive for abdominal pain.       Hemorrhoids   Genitourinary: Negative for dysuria.  All other systems reviewed and are negative.   Physical Exam   Blood pressure  117/85, pulse 118, temperature 98.1 F (36.7 C), temperature source Oral, resp. rate 20, height 5' 0.5" (1.537 m), weight 243 lb (110.224 kg), last menstrual period 01/18/2012. CBG - 105   Physical Exam  Nursing note and vitals reviewed. Constitutional: She is oriented to person, place, and time. She appears well-developed and well-nourished. She appears distressed.  Pt grimacing   HENT:  Head: Normocephalic.  Cardiovascular: Normal rate.   Respiratory: Effort normal.  GI: Soft. There is no tenderness.  Genitourinary: Vagina normal.  sm amt white discharge, no odor,  cx FT/TH/high, presenting part ballottable ? vtx  Wet prep, FFN and cx obtained   Musculoskeletal: Normal range of motion.  Neurological: She is alert and oriented to person, place, and time.  Skin: Skin is warm and dry.  Psychiatric: She has a normal mood and affect. Her behavior is normal.   FHR cat 1 toco - occ irregular ctx with some UI   MAU Course  Procedures    Assessment and Plan  IUP at [redacted]w[redacted]d No cervical dilation FHR reassuring IDGDM -  Ketonuria    FFN, wet prep, - pending GC/CT sent   Hollis Tuller M 09/21/2012, 12:19 PM   Addendum: at 1430   FFN neg Wet prep +clue BPP 8/8  Cancelled ROB and BPP appointment at office today Rescheduled for next week, including growth [place] RX for flagyl 500mg  PO BID x7 days rv'd FKC and PTL precautions Continue diabetic diet, take insulin as instructed, PO hydration  Discharged home in stable condition D/w Dr Pennie Rushing

## 2012-09-22 LAB — GC/CHLAMYDIA PROBE AMP: GC Probe RNA: NEGATIVE

## 2012-09-26 ENCOUNTER — Other Ambulatory Visit: Payer: Self-pay | Admitting: Obstetrics and Gynecology

## 2012-09-26 DIAGNOSIS — O99810 Abnormal glucose complicating pregnancy: Secondary | ICD-10-CM

## 2012-09-28 ENCOUNTER — Encounter: Payer: Medicaid Other | Admitting: Certified Nurse Midwife

## 2012-09-28 ENCOUNTER — Other Ambulatory Visit: Payer: Medicaid Other

## 2012-10-02 ENCOUNTER — Ambulatory Visit: Payer: Medicaid Other

## 2012-10-02 ENCOUNTER — Ambulatory Visit: Payer: Medicaid Other | Admitting: Obstetrics and Gynecology

## 2012-10-02 ENCOUNTER — Encounter: Payer: Self-pay | Admitting: Obstetrics and Gynecology

## 2012-10-02 VITALS — BP 128/68 | Wt 248.0 lb

## 2012-10-02 DIAGNOSIS — O99810 Abnormal glucose complicating pregnancy: Secondary | ICD-10-CM

## 2012-10-02 DIAGNOSIS — O24419 Gestational diabetes mellitus in pregnancy, unspecified control: Secondary | ICD-10-CM

## 2012-10-02 DIAGNOSIS — Z331 Pregnant state, incidental: Secondary | ICD-10-CM

## 2012-10-02 LAB — US OB FOLLOW UP

## 2012-10-02 LAB — POCT FERN TEST: POCT Fern Test: NEGATIVE

## 2012-10-02 NOTE — Progress Notes (Signed)
[redacted]w[redacted]d Pt is having  a lot of pelvic  pain and ?leakage just noticed before U/S  Pt forgot to get glucose readings

## 2012-10-02 NOTE — Progress Notes (Signed)
Tylenol 3 1-2 po every 4-6 hours #20 x 0 refill

## 2012-10-02 NOTE — Progress Notes (Signed)
[redacted]w[redacted]d GDM on Humulin N 10 units at breakfast and 10 units HS. Per patient all CBGs are abnormal with FBS around 120 and 2 hr around 130-140 Ultrasound: EFW=8 lbs >90th, polyhydramnios with AFI at 25.8  BPP=8/8 Nitrazine - and Fern - Pt to increase Humulin N to 14 units tonight and call FBS and 2 hr post breakfast values. Will try to monitor closely for the next week and bring her CBGs within range. Follow-up 1 week with BPP

## 2012-10-03 ENCOUNTER — Telehealth: Payer: Self-pay

## 2012-10-03 NOTE — Telephone Encounter (Signed)
Per SR. Increase to 18 units tonight. Keep 10 units in the am. Have her call all her values for today and tomorrow am.   Pt advised and voiced understanding  Darien Ramus, CMA

## 2012-10-03 NOTE — Telephone Encounter (Signed)
VM from pt states that FBS was 101 this am and 134 two hours after eating.  SR advised, awaiting response.  Darien Ramus, CMA

## 2012-10-04 ENCOUNTER — Telehealth: Payer: Self-pay

## 2012-10-04 NOTE — Telephone Encounter (Signed)
Per consult w/ Dr. Estanislado Pandy, pt called to instruct her to increase her NPH at hs to 20units, and increase her AM dose of NPH to 12 units.  Pt was at a meeting but her mother, Toniann Fail, answered home phone and is her emergency contact and she was given directions to inform pt.  Instructed pt's mom to please have Megan Castaneda call if any questions r/e adjustments, and to also call tomorrow as previously directed w/ her cbg's.

## 2012-10-04 NOTE — Telephone Encounter (Signed)
Call from pt. With blood sugars  10/03/12  - After Lunch = 149 - After Dinner = 148 - Bed time = 155  10/04/2012 - Fasting = 104 - After Breakfast = 125  Dillard, Heather, CMA

## 2012-10-05 ENCOUNTER — Telehealth: Payer: Self-pay | Admitting: Obstetrics and Gynecology

## 2012-10-05 NOTE — Telephone Encounter (Signed)
VM from pt  Bedtime = 94 FBS = 94 2 hours = 125  With increase

## 2012-10-05 NOTE — Telephone Encounter (Signed)
TC from patient--mild swelling in hands and feet noted today. Denies HA, visual sx, epigastric pain.  No hx elevated BP Gestational diabetic, on insulin. Fluid intake of 32 oz today. No contractions, +FM.  Plan: Push po fluids, rest today. F/u by phone tomorrow with update if swelling seems no better or worse, or if any other sx occur.  VL

## 2012-10-05 NOTE — Telephone Encounter (Signed)
LVM for pt to return call with glucose readings after dosage increase  Darien Ramus, CMA

## 2012-10-05 NOTE — Telephone Encounter (Signed)
Advised pt per SR  22 N at bedtime 14 N in the mornings  All weekend, call on Monday with full report  Darien Ramus, CMA

## 2012-10-08 NOTE — Telephone Encounter (Signed)
Called pt to get glucose reading from the weekend. Pt states that she did check it but she doesn't have the readings with her.  Will call office back to give these.  Darien Ramus, CMA

## 2012-10-09 ENCOUNTER — Telehealth: Payer: Self-pay | Admitting: Obstetrics and Gynecology

## 2012-10-09 NOTE — Telephone Encounter (Signed)
Pt currently taking 22 N at bedtime, 14 N in The morning  14th: Fasting - 94 2 Hour - 125 Lunch - 137 Dinner - 111  15th: Pt did not take FBS Lunch - 145 Dinner - 149  16th: Fasting - 103 Lunch - 91 Dinner - 161  17th: Fasting - 90 2 Hr. - 100 Lunch - 153 Dinner - 134  18th: Fasting - 89 Breakfast - 150 (before taking insulin) Lunch - 87

## 2012-10-09 NOTE — Telephone Encounter (Signed)
TC from patient--has been contracting since Saturday, but irregular and mild. Just had 2 contractions that were 5 min apart and stronger.  G2P1, last delivery at 37 weeks No leaking or bleeding, reports +FM Insulin dependent gestational diabetes Cervix was 1 cm on last exam.  Recommended patient push fluids, use bathtub, call in 1 hour with update.

## 2012-10-10 NOTE — Telephone Encounter (Signed)
Per SR keep 22 N at bedtime and change to 18 N in the mornings  Darien Ramus, CMA

## 2012-10-11 ENCOUNTER — Encounter: Payer: Self-pay | Admitting: Obstetrics and Gynecology

## 2012-10-11 ENCOUNTER — Other Ambulatory Visit: Payer: Self-pay | Admitting: Obstetrics and Gynecology

## 2012-10-11 ENCOUNTER — Other Ambulatory Visit: Payer: Medicaid Other

## 2012-10-11 ENCOUNTER — Ambulatory Visit: Payer: Medicaid Other | Admitting: Obstetrics and Gynecology

## 2012-10-11 VITALS — BP 122/76 | Wt 255.0 lb

## 2012-10-11 DIAGNOSIS — O99810 Abnormal glucose complicating pregnancy: Secondary | ICD-10-CM

## 2012-10-11 DIAGNOSIS — O24419 Gestational diabetes mellitus in pregnancy, unspecified control: Secondary | ICD-10-CM

## 2012-10-11 MED ORDER — HYDROCODONE-ACETAMINOPHEN 5-300 MG PO TABS: ORAL_TABLET | ORAL | Status: DC

## 2012-10-11 MED ORDER — INSULIN REGULAR HUMAN 100 UNIT/ML IJ SOLN: INTRAMUSCULAR | Status: DC

## 2012-10-11 NOTE — Progress Notes (Signed)
[redacted]w[redacted]d Pt c/o having tingling feeling in both hands / fingers Also c/o redness and irritation in lower pelvis  Ultrasound shows:  SIUP  S=D     [place] EDD: 11/11/2012           AFI: 20.9 cm 75th%           Cervical length: not evaluated           Placenta localization: posterior           Fetal presentation: vertex Comments: score BPP 8/8 in 12 minutes

## 2012-10-11 NOTE — Progress Notes (Signed)
[redacted]w[redacted]d Nph 18 u in am and 22 u HS. Aggressive management of GDB resolved the polyhydramnios FBS 95  2 hr post lunch 124  2hr post dinner175 Plan: NPH 20 in am, R 8 u before dinner and NPH 24 u HS C/O worsening edema with some carpal tunnel syndrome Still a lot of back pain not improved with Tylenol 3

## 2012-10-14 ENCOUNTER — Encounter (HOSPITAL_COMMUNITY): Payer: Self-pay | Admitting: *Deleted

## 2012-10-14 ENCOUNTER — Inpatient Hospital Stay (HOSPITAL_COMMUNITY)
Admission: AD | Admit: 2012-10-14 | Discharge: 2012-10-14 | Disposition: A | Discharge status: Home / Self Care | Payer: Medicaid Other | Source: Ambulatory Visit | Attending: Obstetrics and Gynecology | Admitting: Obstetrics and Gynecology

## 2012-10-14 DIAGNOSIS — O99810 Abnormal glucose complicating pregnancy: Secondary | ICD-10-CM | POA: Insufficient documentation

## 2012-10-14 DIAGNOSIS — IMO0002 Reserved for concepts with insufficient information to code with codable children: Secondary | ICD-10-CM | POA: Insufficient documentation

## 2012-10-14 DIAGNOSIS — R030 Elevated blood-pressure reading, without diagnosis of hypertension: Secondary | ICD-10-CM | POA: Insufficient documentation

## 2012-10-14 DIAGNOSIS — O4700 False labor before 37 completed weeks of gestation, unspecified trimester: Secondary | ICD-10-CM | POA: Insufficient documentation

## 2012-10-14 DIAGNOSIS — O479 False labor, unspecified: Secondary | ICD-10-CM

## 2012-10-14 LAB — AMNISURE RUPTURE OF MEMBRANE (ROM) NOT AT ARMC: Amnisure ROM: NEGATIVE

## 2012-10-14 LAB — CBC
MCH: 31.1 pg (ref 26.0–34.0)
MCHC: 34.3 g/dL (ref 30.0–36.0)
Platelets: 124 10*3/uL — ABNORMAL LOW (ref 150–400)
RDW: 13.7 % (ref 11.5–15.5)

## 2012-10-14 LAB — URINALYSIS, ROUTINE W REFLEX MICROSCOPIC
Hgb urine dipstick: NEGATIVE
Protein, ur: NEGATIVE mg/dL
Urobilinogen, UA: 0.2 mg/dL (ref 0.0–1.0)

## 2012-10-14 LAB — COMPREHENSIVE METABOLIC PANEL
Alkaline Phosphatase: 162 U/L — ABNORMAL HIGH (ref 39–117)
BUN: 8 mg/dL (ref 6–23)
Calcium: 9.2 mg/dL (ref 8.4–10.5)
Creatinine, Ser: 0.62 mg/dL (ref 0.50–1.10)
GFR calc Af Amer: >90 mL/min (ref >90)
Glucose, Bld: 74 mg/dL (ref 70–99)
Total Protein: 5.5 g/dL — ABNORMAL LOW (ref 6.0–8.3)

## 2012-10-14 LAB — LACTATE DEHYDROGENASE: LDH: 148 U/L (ref 94–250)

## 2012-10-14 LAB — URIC ACID: Uric Acid, Serum: 5.5 mg/dL (ref 2.4–7.0)

## 2012-10-14 LAB — URINE MICROSCOPIC-ADD ON

## 2012-10-14 MED ORDER — INSULIN NPH (HUMAN) (ISOPHANE) 100 UNIT/ML ~~LOC~~ SUSP: 26.0000 [IU] | Freq: Every day | SUBCUTANEOUS | Status: DC

## 2012-10-14 NOTE — MAU Note (Signed)
PT presents with complaints of having a lot of  Generalized discomfort throughout the night and states that contractions started this morning around 730. Denies any vaginal bleeding or leakage of fluid.

## 2012-10-14 NOTE — MAU Provider Note (Signed)
History   27 yo G2P1001 at 36 weeks presented c/o onset of frequent contractions at 7:30am today.  Denies bleeding, questions whether she is leaking, reports normal FM.  FBS this am was 96--patient took 20u NPH on way to hospital. Current regimen (since office visit on 10/11/12) = NPH 20 u in am, R 8 u before dinner, NPH 24 u HS.  CBGs on 2/22 (1st day fully of new regimen):  FBS101, 2 hour breakfast 139, 2 hour lunch missing, 2 hour dinner 135.  Also c/o increased swelling in hands and feet.  Denies HA, visual sx, or epigastric pain.  She has been normotensive during prenatal care.  Last [place] 10/11/12 for BPP/AFI--AFI 20.9, 75%ile, vtx, BPP 8/8 Growth [place] 10/02/12--3669 gm, > 90%ile, AFI 25.8, 95%ile. Scheduled for f/u growth [place] in upcoming week.   Cervix has been 1 cm, 50%, vtx, -2 on exam 10/02/12.  Patient Active Problem List  Diagnosis  . Pregnant state, incidental  . Obesity  . Anxiety  . Kidney stone  . Ear infection  . Multiple drug allergies--ibuprophen, amoxicillin, sulfa  . Gestational diabetes  . Gestational diabetes mellitus, antepartum  Possible Type 2 diabetes, since had abnormal early glucola at 17 weeks.   Chief Complaint  Patient presents with  . Labor Eval     OB History   Grav Para Term Preterm Abortions TAB SAB Ect Mult Living   2 1 1       1     2011--SVB, 37 weeks, 7+4, female, at [facility], epidural  Past Medical History  Diagnosis Date  . Complication of anesthesia 2011    Has ongoing shoulder and back pain from epidural  . Asthma     triggered by dust;colds;has inhaler prn  . Infection     Yeast;not frequent  . Infection     UTI;not frequent  . Chlamydia 2006    was treated  . Gonorrhea 2006    was treated  . PID (acute pelvic inflammatory disease) 2006    Antibxs;found out after + GC/CT  . Bipolar 1 disorder     Meds in the past  . Anxiety     Meds in the past  . Fibromyalgia   . Diabetes mellitus without complication   . Gestational  diabetes     Past Surgical History  Procedure Laterality Date  . Appendectomy  05/2008  . Wisdom tooth extraction  1999    All 4 removed    Family History  Problem Relation Age of Onset  . Thyroid disease Mother   . Cancer Mother     Gallbladder  . Depression Mother   . Anxiety disorder Mother   . Fibromyalgia Mother   . Diabetes Maternal Grandmother   . Bipolar disorder Maternal Grandmother   . Depression Maternal Grandmother   . Anxiety disorder Maternal Grandmother   . Fibromyalgia Maternal Grandmother   . Diabetes Maternal Grandfather   . Dementia Paternal Grandmother   . Dementia Paternal Grandfather   . Other Neg Hx     History  Substance Use Topics  . Smoking status: Former Smoker    Types: Cigarettes    Quit date: 07/25/2010  . Smokeless tobacco: Never Used  . Alcohol Use: No    Allergies:  Allergies  Allergen Reactions  . Motrin (Ibuprofen) Other (See Comments)    Stomach pain  . Amoxicillin Rash  . Sulfa Antibiotics Rash    Prescriptions prior to admission  Medication Sig Dispense Refill  . acetaminophen (TYLENOL)  500 MG tablet Take 500 mg by mouth as needed. For pain      . benzocaine-resorcinol (VAGISIL) 5-2 % vaginal cream Place 1 application vaginally 3 (three) times daily as needed for itching.      . cyclobenzaprine (FLEXERIL) 10 MG tablet Take 1 tablet (10 mg total) by mouth 3 (three) times daily as needed for muscle spasms.  30 tablet  2  . docusate sodium (COLACE) 100 MG capsule Take 200 mg by mouth daily. For bm      . glyBURIDE (DIABETA) 5 MG tablet Take 2 tablets by mouth bid  30 tablet  6  . Hydrocodone-Acetaminophen 5-300 MG TABS 1 tablet as needed (pain). 1 tablet every 4-6 hours as needed for pain      . insulin NPH (HUMULIN N,NOVOLIN N) 100 UNIT/ML injection Inject 24 Units into the skin at bedtime.       . insulin regular (NOVOLIN R,HUMULIN R) 100 units/mL injection 8 units 30 minutes before dinner      . OVER THE COUNTER MEDICATION  Place 1 application rectally 3 (three) times daily as needed. Pt uses an OTC hemorrhoid oint. For swelling and itch..      . OVER THE COUNTER MEDICATION Place 1 suppository rectally once. Pt used an OTC hemorrhoid suppository one time      . Prenatal Vit-Fe Fumarate-FA (PRENATAL MULTIVITAMIN) TABS Take 1 tablet by mouth daily at 12 noon.      . ranitidine (ZANTAC) 300 MG tablet TAKE ONE TABLET BY MOUTH AT BEDTIME  30 tablet  1  . [DISCONTINUED] Hydrocodone-Acetaminophen 5-300 MG TABS 1 tablet every 4-6 hours as needed for pain  30 each  0  . [DISCONTINUED] insulin regular (NOVOLIN R,HUMULIN R) 100 units/mL injection 8 units 30 minutes before dinner  10 mL  12     Physical Exam   Blood pressure 131/96, pulse 106, temperature 97.5 F (36.4 C), temperature source Oral, resp. rate 18, height 5\' 3"  (1.6 m), weight 255 lb (115.667 kg), last menstrual period 01/18/2012.  Chest clear Heart RRR without murmur Abd gravid, NT Pelvic--cervix posterior, 1 cm, 50%, vtx, -2.  No leaking noted. Ext--DTR 1-2+, no clonus, 1+ edema in LE, + hand edema.  FHR Category 1 UCs q 2-4 min, mild to palpation.  ED Course  IUP at 36 weeks ? Early vs prodromal labor Gestational diabetes, insulin-dependent Elevated BP  Plan: Serial BPs PIH labs UA Light breakfast to balance recent insulin Amnisure GBS today--sent for culture.   Nigel Bridgeman CNM, [place] 10/14/2012 9:44 AM  Addendum:  Ceasar Mons Vitals:   10/14/12 1030 10/14/12 1046 10/14/12 1101 10/14/12 1135  BP: 132/75 139/87 135/58 135/58  Pulse: 102 97 96 96  Temp:      TempSrc:      Resp:    18  Height:      Weight:      Single value 140/100 on arrival.  Results for orders placed during the hospital encounter of 10/14/12 (from the past 24 hour(s))  URINALYSIS, ROUTINE W REFLEX MICROSCOPIC     Status: Abnormal   Collection Time    10/14/12  9:00 AM      Result Value Range   Color, Urine YELLOW  YELLOW   APPearance HAZY (*) CLEAR   Specific  Gravity, Urine 1.010  1.005 - 1.030   pH 6.0  5.0 - 8.0   Glucose, UA NEGATIVE  NEGATIVE mg/dL   Hgb urine dipstick NEGATIVE  NEGATIVE   Bilirubin Urine NEGATIVE  NEGATIVE   Ketones, ur NEGATIVE  NEGATIVE mg/dL   Protein, ur NEGATIVE  NEGATIVE mg/dL   Urobilinogen, UA 0.2  0.0 - 1.0 mg/dL   Nitrite NEGATIVE  NEGATIVE   Leukocytes, UA SMALL (*) NEGATIVE  URINE MICROSCOPIC-ADD ON     Status: Abnormal   Collection Time    10/14/12  9:00 AM      Result Value Range   Squamous Epithelial / LPF MANY (*) RARE   WBC, UA 7-10  <3 WBC/hpf   RBC / HPF 0-2  <3 RBC/hpf   Bacteria, UA MANY (*) RARE   Urine-Other RARE YEAST    AMNISURE RUPTURE OF MEMBRANE (ROM)     Status: None   Collection Time    10/14/12  9:30 AM      Result Value Range   Amnisure ROM NEGATIVE    CBC     Status: Abnormal   Collection Time    10/14/12  9:38 AM      Result Value Range   WBC 9.4  4.0 - 10.5 K/uL   RBC 3.86 (*) 3.87 - 5.11 MIL/uL   Hemoglobin 12.0  12.0 - 15.0 g/dL   HCT 16.1 (*) 09.6 - 04.5 %   MCV 90.7  78.0 - 100.0 fL   MCH 31.1  26.0 - 34.0 pg   MCHC 34.3  30.0 - 36.0 g/dL   RDW 40.9  81.1 - 91.4 %   Platelets 124 (*) 150 - 400 K/uL  COMPREHENSIVE METABOLIC PANEL     Status: Abnormal   Collection Time    10/14/12  9:38 AM      Result Value Range   Sodium 137  135 - 145 mEq/L   Potassium 3.9  3.5 - 5.1 mEq/L   Chloride 103  96 - 112 mEq/L   CO2 22  19 - 32 mEq/L   Glucose, Bld 74  70 - 99 mg/dL   BUN 8  6 - 23 mg/dL   Creatinine, Ser 7.82  0.50 - 1.10 mg/dL   Calcium 9.2  8.4 - 95.6 mg/dL   Total Protein 5.5 (*) 6.0 - 8.3 g/dL   Albumin 2.4 (*) 3.5 - 5.2 g/dL   AST 16  0 - 37 U/L   ALT 11  0 - 35 U/L   Alkaline Phosphatase 162 (*) 39 - 117 U/L   Total Bilirubin 0.4  0.3 - 1.2 mg/dL   GFR calc non Af Amer >90  >90 mL/min   GFR calc Af Amer >90  >90 mL/min  LACTATE DEHYDROGENASE     Status: None   Collection Time    10/14/12  9:38 AM      Result Value Range   LDH 148  94 - 250 U/L  URIC  ACID     Status: None   Collection Time    10/14/12  9:38 AM      Result Value Range   Uric Acid, Serum 5.5  2.4 - 7.0 mg/dL   Cervix unchanged on recheck. NST reactive. UCs irregular, largely irritability pattern.  Consulted with Dr. Estanislado Pandy. D/C home. Start 24 hour urine for protein today, complete tomorrow around lunchtime, and return it to office tomorrow to be sent STAT. Resting at home. Increase evening NPH to 26 u. At office tomorrow, needs BP check and review of CBGs. PIH precautions reviewed with patient. [place] for EFW scheduled on Friday. May need recheck of platelets = 124 today.  Nigel Bridgeman, CNM 10/14/12 11:45a

## 2012-10-15 ENCOUNTER — Other Ambulatory Visit: Payer: Self-pay | Admitting: Obstetrics and Gynecology

## 2012-10-15 LAB — CREATININE, URINE, 24 HOUR: Creatinine, Urine: 69.2 mg/dL

## 2012-10-15 LAB — URINE CULTURE: Colony Count: 7000

## 2012-10-16 LAB — CULTURE, BETA STREP (GROUP B ONLY)

## 2012-10-18 ENCOUNTER — Encounter (HOSPITAL_COMMUNITY): Payer: Self-pay | Admitting: *Deleted

## 2012-10-18 ENCOUNTER — Inpatient Hospital Stay (HOSPITAL_COMMUNITY)
Admission: AD | Admit: 2012-10-18 | Discharge: 2012-10-18 | Disposition: A | Discharge status: Home / Self Care | Payer: Medicaid Other | Source: Ambulatory Visit | Attending: Obstetrics and Gynecology | Admitting: Obstetrics and Gynecology

## 2012-10-18 DIAGNOSIS — O409XX0 Polyhydramnios, unspecified trimester, not applicable or unspecified: Secondary | ICD-10-CM | POA: Diagnosis present

## 2012-10-18 DIAGNOSIS — N200 Calculus of kidney: Secondary | ICD-10-CM

## 2012-10-18 DIAGNOSIS — O99891 Other specified diseases and conditions complicating pregnancy: Secondary | ICD-10-CM | POA: Insufficient documentation

## 2012-10-18 DIAGNOSIS — O3660X0 Maternal care for excessive fetal growth, unspecified trimester, not applicable or unspecified: Secondary | ICD-10-CM | POA: Insufficient documentation

## 2012-10-18 DIAGNOSIS — O24419 Gestational diabetes mellitus in pregnancy, unspecified control: Secondary | ICD-10-CM

## 2012-10-18 DIAGNOSIS — O99810 Abnormal glucose complicating pregnancy: Secondary | ICD-10-CM | POA: Insufficient documentation

## 2012-10-18 DIAGNOSIS — R030 Elevated blood-pressure reading, without diagnosis of hypertension: Secondary | ICD-10-CM | POA: Insufficient documentation

## 2012-10-18 DIAGNOSIS — O133 Gestational [pregnancy-induced] hypertension without significant proteinuria, third trimester: Secondary | ICD-10-CM

## 2012-10-18 HISTORY — DX: Chronic kidney disease, unspecified: N18.9

## 2012-10-18 HISTORY — DX: Urinary tract infection, site not specified: N39.0

## 2012-10-18 LAB — COMPREHENSIVE METABOLIC PANEL
BUN: 9 mg/dL (ref 6–23)
CO2: 20 mEq/L (ref 19–32)
Chloride: 103 mEq/L (ref 96–112)
Creatinine, Ser: 0.54 mg/dL (ref 0.50–1.10)
GFR calc non Af Amer: >90 mL/min (ref >90)
Glucose, Bld: 85 mg/dL (ref 70–99)
Total Bilirubin: 0.3 mg/dL (ref 0.3–1.2)

## 2012-10-18 LAB — CBC
HCT: 34 % — ABNORMAL LOW (ref 36.0–46.0)
Hemoglobin: 11.7 g/dL — ABNORMAL LOW (ref 12.0–15.0)
MCV: 90.9 fL (ref 78.0–100.0)
RBC: 3.74 MIL/uL — ABNORMAL LOW (ref 3.87–5.11)
WBC: 7.2 10*3/uL (ref 4.0–10.5)

## 2012-10-18 LAB — URINE MICROSCOPIC-ADD ON

## 2012-10-18 LAB — URINALYSIS, ROUTINE W REFLEX MICROSCOPIC
Glucose, UA: NEGATIVE mg/dL
Hgb urine dipstick: NEGATIVE
Protein, ur: NEGATIVE mg/dL
Specific Gravity, Urine: 1.015 (ref 1.005–1.030)

## 2012-10-18 LAB — LACTATE DEHYDROGENASE: LDH: 198 U/L (ref 94–250)

## 2012-10-18 LAB — URIC ACID: Uric Acid, Serum: 5.1 mg/dL (ref 2.4–7.0)

## 2012-10-18 MED ORDER — INSULIN NPH (HUMAN) (ISOPHANE) 100 UNIT/ML ~~LOC~~ SUSP: 22.0000 [IU] | Freq: Every day | SUBCUTANEOUS | Status: DC

## 2012-10-18 NOTE — MAU Note (Signed)
Name and DOB verified, pt confirmed spelling is correct on ID band.

## 2012-10-18 NOTE — MAU Provider Note (Signed)
History   27 yo G2P1001 at 36 4/7 weeks presented after calling CNM last night and talking to office this pm regarding home BPs of 120s-140s/100-118.  Reports +HA, "yellow spots" seen yesterday, denies epigastric pain.  Had negative 24 hour urine and PIH labs on 3/24, when BPs during MAU visit were 130s/58-96.    Reports CBG have been "better" last 24 hours, with prior issues with achieving glucose control.  Currently on  NPH 20 u in am, R 8 u before dinner, NPH 26 u HS. Today's CBGs:  FBS 82                            2 hour breakfast 116                            2 hour lunch 90  Has appt at [facility] tomorrow for [place] for growth and BPP--had EFW of 3669 gm, > 90%ile, AFI 25.8, 95%ile on 10/02/12.  Patient Active Problem List  Diagnosis  . Pregnant state, incidental  . Obesity  . Anxiety  . Kidney stone  . Ear infection  . Multiple drug allergies--ibuprophen, amoxicillin, sulfa  . Gestational diabetes  . LGA (large for gestational age) infant  . Polyhydramnios      Chief Complaint  Patient presents with  . PIH eval     OB History   Grav Para Term Preterm Abortions TAB SAB Ect Mult Living   2 1 1       1       Past Medical History  Diagnosis Date  . Complication of anesthesia 2011    Has ongoing shoulder and back pain from epidural  . Asthma     triggered by dust;colds;has inhaler prn  . Infection     Yeast;not frequent  . Infection     UTI;not frequent  . Chlamydia 2006    was treated  . Gonorrhea 2006    was treated  . PID (acute pelvic inflammatory disease) 2006    Antibxs;found out after + GC/CT  . Bipolar 1 disorder     Meds in the past  . Anxiety     Meds in the past  . Fibromyalgia   . Diabetes mellitus without complication   . Gestational diabetes   . Pregnancy induced hypertension   . Chronic kidney disease     kidney stones  . Urinary tract infection     Past Surgical History  Procedure Laterality Date  . Appendectomy  05/2008  . Wisdom tooth  extraction  1999    All 4 removed    Family History  Problem Relation Age of Onset  . Thyroid disease Mother   . Cancer Mother     Gallbladder  . Depression Mother   . Anxiety disorder Mother   . Fibromyalgia Mother   . Diabetes Maternal Grandmother   . Bipolar disorder Maternal Grandmother   . Depression Maternal Grandmother   . Anxiety disorder Maternal Grandmother   . Fibromyalgia Maternal Grandmother   . Diabetes Maternal Grandfather   . Dementia Paternal Grandmother   . Dementia Paternal Grandfather   . Other Neg Hx     History  Substance Use Topics  . Smoking status: Former Smoker    Types: Cigarettes    Quit date: 07/25/2010  . Smokeless tobacco: Never Used  . Alcohol Use: No    Allergies:  Allergies  Allergen  Reactions  . Motrin (Ibuprofen) Other (See Comments)    Stomach pain  . Amoxicillin Rash  . Sulfa Antibiotics Rash    Prescriptions prior to admission  Medication Sig Dispense Refill  . acetaminophen (TYLENOL) 500 MG tablet Take 500 mg by mouth daily as needed. For pain      . benzocaine-resorcinol (VAGISIL) 5-2 % vaginal cream Place 1 application vaginally 3 (three) times daily as needed for itching.      . cyclobenzaprine (FLEXERIL) 10 MG tablet Take 10 mg by mouth daily as needed for muscle spasms.      Marland Kitchen docusate sodium (COLACE) 100 MG capsule Take 200 mg by mouth daily. For bm      . doxylamine, Sleep, (UNISOM) 25 MG tablet Take 25 mg by mouth daily as needed for sleep.      Marland Kitchen Hydrocodone-Acetaminophen 5-300 MG TABS 1 tablet daily as needed (pain). 1 tablet every 4-6 hours as needed for pain      . insulin NPH (HUMULIN N,NOVOLIN N) 100 UNIT/ML injection Inject 26 Units into the skin at bedtime.  1 vial  4  . insulin regular (NOVOLIN R,HUMULIN R) 100 units/mL injection 8 units 30 minutes before dinner      . OVER THE COUNTER MEDICATION Place 1 application rectally 3 (three) times daily as needed.       Marland Kitchen OVER THE COUNTER MEDICATION Place 1  suppository rectally once.       . Prenatal Vit-Fe Fumarate-FA (PRENATAL MULTIVITAMIN) TABS Take 1 tablet by mouth daily at 12 noon.      . ranitidine (ZANTAC) 300 MG tablet TAKE ONE TABLET BY MOUTH AT BEDTIME  30 tablet  1  . [DISCONTINUED] cyclobenzaprine (FLEXERIL) 10 MG tablet Take 1 tablet (10 mg total) by mouth 3 (three) times daily as needed for muscle spasms.  30 tablet  2     Physical Exam   Blood pressure 146/80, pulse 99, temperature 98.2 F (36.8 C), temperature source Oral, resp. rate 20, last menstrual period 01/18/2012.  Chest clear Heart RRR without murmur Abd gravid, NT Pelvic--deferred Ext--DTR 1+, no clonus, 1+ edema  FHR Category 1 UCs irregular     ED Course  IUP at 36 4/7 weeks Elevated BP at home Gestational diabetes LGA/polyhydramnios  Plan: Consulted with Dr. Estanislado Pandy [facility] labs UA Serial BPs    Megan Castaneda CNM, MN 10/18/2012 6:38 PM  Addendum:  Ceasar Mons Vitals:   10/18/12 1830 10/18/12 1845 10/18/12 1900 10/18/12 1923  BP: 144/80 145/75 149/72   Pulse: 100 96 101   Temp:    98 F (36.7 C)  TempSrc:      Resp:    20   Results for orders placed during the hospital encounter of 10/18/12 (from the past 24 hour(s))  URINALYSIS, ROUTINE W REFLEX MICROSCOPIC     Status: Abnormal   Collection Time    10/18/12  5:00 PM      Result Value Range   Color, Urine YELLOW  YELLOW   APPearance HAZY (*) CLEAR   Specific Gravity, Urine 1.015  1.005 - 1.030   pH 6.5  5.0 - 8.0   Glucose, UA NEGATIVE  NEGATIVE mg/dL   Hgb urine dipstick NEGATIVE  NEGATIVE   Bilirubin Urine NEGATIVE  NEGATIVE   Ketones, ur NEGATIVE  NEGATIVE mg/dL   Protein, ur NEGATIVE  NEGATIVE mg/dL   Urobilinogen, UA 0.2  0.0 - 1.0 mg/dL   Nitrite NEGATIVE  NEGATIVE   Leukocytes, UA SMALL (*) NEGATIVE  URINE MICROSCOPIC-ADD ON     Status: Abnormal   Collection Time    10/18/12  5:00 PM      Result Value Range   Squamous Epithelial / LPF MANY (*) RARE   WBC, UA 3-6  <3 WBC/hpf    RBC / HPF 0-2  <3 RBC/hpf   Bacteria, UA FEW (*) RARE   Urine-Other RARE YEAST    CBC     Status: Abnormal   Collection Time    10/18/12  5:20 PM      Result Value Range   WBC 7.2  4.0 - 10.5 K/uL   RBC 3.74 (*) 3.87 - 5.11 MIL/uL   Hemoglobin 11.7 (*) 12.0 - 15.0 g/dL   HCT 40.9 (*) 81.1 - 91.4 %   MCV 90.9  78.0 - 100.0 fL   MCH 31.3  26.0 - 34.0 pg   MCHC 34.4  30.0 - 36.0 g/dL   RDW 78.2  95.6 - 21.3 %   Platelets 133 (*) 150 - 400 K/uL  COMPREHENSIVE METABOLIC PANEL     Status: Abnormal   Collection Time    10/18/12  5:20 PM      Result Value Range   Sodium 137  135 - 145 mEq/L   Potassium 3.9  3.5 - 5.1 mEq/L   Chloride 103  96 - 112 mEq/L   CO2 20  19 - 32 mEq/L   Glucose, Bld 85  70 - 99 mg/dL   BUN 9  6 - 23 mg/dL   Creatinine, Ser 0.86  0.50 - 1.10 mg/dL   Calcium 9.0  8.4 - 57.8 mg/dL   Total Protein 5.8 (*) 6.0 - 8.3 g/dL   Albumin 2.3 (*) 3.5 - 5.2 g/dL   AST 20  0 - 37 U/L   ALT 11  0 - 35 U/L   Alkaline Phosphatase 166 (*) 39 - 117 U/L   Total Bilirubin 0.3  0.3 - 1.2 mg/dL   GFR calc non Af Amer >90  >90 mL/min   GFR calc Af Amer >90  >90 mL/min  LACTATE DEHYDROGENASE     Status: None   Collection Time    10/18/12  5:20 PM      Result Value Range   LDH 198  94 - 250 U/L  URIC ACID     Status: None   Collection Time    10/18/12  5:20 PM      Result Value Range   Uric Acid, Serum 5.1  2.4 - 7.0 mg/dL   Consulted with Dr. Estanislado Pandy. D/C home. Decrease hs insulin to 22 units.  Hold am and evening dose of insulin tomorrow prior to [facility] appt. Continue CBGs as usual--bring results to visit tomorrow. PIH precautions and [facility]KC reviewed. Scheduled for [place] for growth and BPP tomorrow.  Megan Castaneda, CNM 10/18/12 7:45p

## 2012-10-18 NOTE — MAU Note (Signed)
Last night had headache and was seeing yellow spots.  None today.  Has some pressure, no real pain.

## 2012-10-18 NOTE — MAU Note (Signed)
Bp was high last night and  Today 148/108, had been higher earlier.  Just doesn't feel good or right.

## 2012-10-20 ENCOUNTER — Encounter (HOSPITAL_COMMUNITY): Payer: Self-pay | Admitting: Anesthesiology

## 2012-10-20 ENCOUNTER — Encounter (HOSPITAL_COMMUNITY): Payer: Self-pay | Admitting: *Deleted

## 2012-10-20 ENCOUNTER — Inpatient Hospital Stay (HOSPITAL_COMMUNITY)
Admission: AD | Admit: 2012-10-20 | Discharge: 2012-10-23 | DRG: 766 | Disposition: A | Discharge status: Home / Self Care | Payer: Medicaid Other | Source: Ambulatory Visit | Attending: Obstetrics and Gynecology | Admitting: Obstetrics and Gynecology

## 2012-10-20 ENCOUNTER — Encounter (HOSPITAL_COMMUNITY)
Admission: AD | Disposition: A | Discharge status: Home / Self Care | Payer: Self-pay | Source: Ambulatory Visit | Attending: Obstetrics and Gynecology

## 2012-10-20 ENCOUNTER — Inpatient Hospital Stay (HOSPITAL_COMMUNITY): Payer: Medicaid Other | Admitting: Anesthesiology

## 2012-10-20 DIAGNOSIS — O4290 Premature rupture of membranes, unspecified as to length of time between rupture and onset of labor, unspecified weeks of gestation: Secondary | ICD-10-CM

## 2012-10-20 DIAGNOSIS — Z794 Long term (current) use of insulin: Secondary | ICD-10-CM

## 2012-10-20 DIAGNOSIS — Z889 Allergy status to unspecified drugs, medicaments and biological substances status: Secondary | ICD-10-CM

## 2012-10-20 DIAGNOSIS — O3660X0 Maternal care for excessive fetal growth, unspecified trimester, not applicable or unspecified: Principal | ICD-10-CM | POA: Diagnosis present

## 2012-10-20 DIAGNOSIS — N200 Calculus of kidney: Secondary | ICD-10-CM

## 2012-10-20 DIAGNOSIS — Z880 Allergy status to penicillin: Secondary | ICD-10-CM

## 2012-10-20 DIAGNOSIS — O99814 Abnormal glucose complicating childbirth: Secondary | ICD-10-CM | POA: Diagnosis present

## 2012-10-20 DIAGNOSIS — O24419 Gestational diabetes mellitus in pregnancy, unspecified control: Secondary | ICD-10-CM | POA: Diagnosis present

## 2012-10-20 LAB — COMPREHENSIVE METABOLIC PANEL
ALT: 12 U/L (ref 0–35)
AST: 18 U/L (ref 0–37)
Albumin: 2.3 g/dL — ABNORMAL LOW (ref 3.5–5.2)
CO2: 21 mEq/L (ref 19–32)
Calcium: 9.3 mg/dL (ref 8.4–10.5)
Chloride: 103 mEq/L (ref 96–112)
Creatinine, Ser: 0.56 mg/dL (ref 0.50–1.10)
GFR calc non Af Amer: >90 mL/min (ref >90)
Sodium: 137 mEq/L (ref 135–145)
Total Bilirubin: 0.3 mg/dL (ref 0.3–1.2)

## 2012-10-20 LAB — URINE CULTURE

## 2012-10-20 LAB — TYPE AND SCREEN: ABO/RH(D): A POS

## 2012-10-20 LAB — CBC
Hemoglobin: 11.4 g/dL — ABNORMAL LOW (ref 12.0–15.0)
Platelets: 133 10*3/uL — ABNORMAL LOW (ref 150–400)
RBC: 3.66 MIL/uL — ABNORMAL LOW (ref 3.87–5.11)
WBC: 8 10*3/uL (ref 4.0–10.5)

## 2012-10-20 LAB — GLUCOSE, CAPILLARY
Glucose-Capillary: 111 mg/dL — ABNORMAL HIGH (ref 70–99)
Glucose-Capillary: 94 mg/dL (ref 70–99)

## 2012-10-20 LAB — URIC ACID: Uric Acid, Serum: 5.6 mg/dL (ref 2.4–7.0)

## 2012-10-20 SURGERY — Surgical Case
Anesthesia: Spinal | Site: Abdomen | Wound class: Clean Contaminated

## 2012-10-20 MED ORDER — OXYTOCIN 40 UNITS IN LACTATED RINGERS INFUSION - SIMPLE MED
INTRAVENOUS | Status: DC | PRN
  Administered 2012-10-20: 40 [IU] via INTRAVENOUS

## 2012-10-20 MED ORDER — CEFAZOLIN SODIUM-DEXTROSE 2-3 GM-% IV SOLR: 2.0000 g | Freq: Once | INTRAVENOUS | Status: DC

## 2012-10-20 MED ORDER — METOCLOPRAMIDE HCL 5 MG/ML IJ SOLN
INTRAMUSCULAR | Status: DC | PRN
  Administered 2012-10-20: 10 mg via INTRAVENOUS

## 2012-10-20 MED ORDER — GENTAMICIN SULFATE 40 MG/ML IJ SOLN
Freq: Three times a day (TID) | INTRAMUSCULAR | Status: DC
  Filled 2012-10-20 (×2): qty 4.5

## 2012-10-20 MED ORDER — GENTAMICIN SULFATE 40 MG/ML IJ SOLN
Freq: Once | INTRAVENOUS | Status: AC
  Administered 2012-10-20: 22:00:00 via INTRAVENOUS
  Filled 2012-10-20: qty 4.75

## 2012-10-20 MED ORDER — ONDANSETRON HCL 4 MG/2ML IJ SOLN
INTRAMUSCULAR | Status: DC | PRN
  Administered 2012-10-20: 4 mg via INTRAVENOUS

## 2012-10-20 MED ORDER — CLINDAMYCIN PHOSPHATE 900 MG/50ML IV SOLN: 900.0000 mg | Freq: Once | INTRAVENOUS | Status: DC

## 2012-10-20 MED ORDER — CITRIC ACID-SODIUM CITRATE 334-500 MG/5ML PO SOLN
30.0000 mL | Freq: Once | ORAL | Status: AC
  Administered 2012-10-20: 30 mL via ORAL
  Filled 2012-10-20: qty 15

## 2012-10-20 MED ORDER — LACTATED RINGERS IV SOLN
INTRAVENOUS | Status: DC
  Administered 2012-10-20 (×3): via INTRAVENOUS

## 2012-10-20 MED ORDER — BUPIVACAINE IN DEXTROSE 0.75-8.25 % IT SOLN
INTRATHECAL | Status: AC
  Filled 2012-10-20: qty 2

## 2012-10-20 MED ORDER — PANTOPRAZOLE SODIUM 40 MG IV SOLR
40.0000 mg | Freq: Once | INTRAVENOUS | Status: AC
  Administered 2012-10-20: 40 mg via INTRAVENOUS
  Filled 2012-10-20: qty 40

## 2012-10-20 MED ORDER — PHENYLEPHRINE HCL 10 MG/ML IJ SOLN
INTRAMUSCULAR | Status: DC | PRN
  Administered 2012-10-20 – 2012-10-21 (×7): 80 ug via INTRAVENOUS

## 2012-10-20 MED ORDER — 0.9 % SODIUM CHLORIDE (POUR BTL) OPTIME
TOPICAL | Status: DC | PRN
  Administered 2012-10-20: 200 mL

## 2012-10-20 MED ORDER — PHENYLEPHRINE 40 MCG/ML (10ML) SYRINGE FOR IV PUSH (FOR BLOOD PRESSURE SUPPORT)
PREFILLED_SYRINGE | INTRAVENOUS | Status: AC
  Filled 2012-10-20: qty 5

## 2012-10-20 MED ORDER — MORPHINE SULFATE (PF) 0.5 MG/ML IJ SOLN
INTRAMUSCULAR | Status: DC | PRN
  Administered 2012-10-20: 2 mg via INTRAVENOUS
  Administered 2012-10-20: .15 mg via INTRATHECAL
  Administered 2012-10-20: 2.85 mg via INTRAVENOUS

## 2012-10-20 MED ORDER — CLINDAMYCIN PHOSPHATE 900 MG/50ML IV SOLN
900.0000 mg | Freq: Once | INTRAVENOUS | Status: DC
  Filled 2012-10-20: qty 50

## 2012-10-20 MED ORDER — EPHEDRINE SULFATE 50 MG/ML IJ SOLN
INTRAMUSCULAR | Status: DC | PRN
  Administered 2012-10-20: 10 mg via INTRAVENOUS

## 2012-10-20 SURGICAL SUPPLY — 36 items
BENZOIN TINCTURE PRP APPL 2/3 (GAUZE/BANDAGES/DRESSINGS) ×2 IMPLANT
CLOTH BEACON ORANGE TIMEOUT ST (SAFETY) ×2 IMPLANT
CONTAINER PREFILL 10% NBF 15ML (MISCELLANEOUS) IMPLANT
DRAIN JACKSON PRT FLT 10 (DRAIN) ×2 IMPLANT
DRAPE LG THREE QUARTER DISP (DRAPES) ×2 IMPLANT
DRSG OPSITE POSTOP 4X10 (GAUZE/BANDAGES/DRESSINGS) IMPLANT
DRSG OPSITE POSTOP 4X12 (GAUZE/BANDAGES/DRESSINGS) ×2 IMPLANT
DURAPREP 26ML APPLICATOR (WOUND CARE) ×2 IMPLANT
ELECT REM PT RETURN 9FT ADLT (ELECTROSURGICAL) ×2
ELECTRODE REM PT RTRN 9FT ADLT (ELECTROSURGICAL) ×1 IMPLANT
EVACUATOR SILICONE 100CC (DRAIN) ×2 IMPLANT
EXTRACTOR VACUUM M CUP 4 TUBE (SUCTIONS) IMPLANT
GLOVE BIO SURGEON STRL SZ 6.5 (GLOVE) ×2 IMPLANT
GLOVE BIOGEL PI IND STRL 7.0 (GLOVE) ×1 IMPLANT
GLOVE BIOGEL PI INDICATOR 7.0 (GLOVE) ×1
GOWN STRL REIN XL XLG (GOWN DISPOSABLE) ×4 IMPLANT
KIT ABG SYR 3ML LUER SLIP (SYRINGE) IMPLANT
NEEDLE HYPO 25X5/8 SAFETYGLIDE (NEEDLE) IMPLANT
NS IRRIG 1000ML POUR BTL (IV SOLUTION) ×2 IMPLANT
PACK C SECTION WH (CUSTOM PROCEDURE TRAY) ×2 IMPLANT
PAD OB MATERNITY 4.3X12.25 (PERSONAL CARE ITEMS) ×2 IMPLANT
RTRCTR C-SECT PINK 34CM XLRG (MISCELLANEOUS) ×2 IMPLANT
SLEEVE SCD COMPRESS KNEE MED (MISCELLANEOUS) ×2 IMPLANT
STAPLER VISISTAT 35W (STAPLE) IMPLANT
STRIP CLOSURE SKIN 1/2X4 (GAUZE/BANDAGES/DRESSINGS) ×2 IMPLANT
SUT CHROMIC 0 CT 1 (SUTURE) ×2 IMPLANT
SUT MNCRL AB 3-0 PS2 27 (SUTURE) ×2 IMPLANT
SUT PLAIN 2 0 (SUTURE) ×2
SUT PLAIN 2 0 XLH (SUTURE) ×2 IMPLANT
SUT PLAIN ABS 2-0 CT1 27XMFL (SUTURE) ×2 IMPLANT
SUT SILK 2 0 SH (SUTURE) ×2 IMPLANT
SUT VIC AB 0 CTX 36 (SUTURE) ×5
SUT VIC AB 0 CTX36XBRD ANBCTRL (SUTURE) ×5 IMPLANT
TOWEL OR 17X24 6PK STRL BLUE (TOWEL DISPOSABLE) ×6 IMPLANT
TRAY FOLEY CATH 14FR (SET/KITS/TRAYS/PACK) ×2 IMPLANT
WATER STERILE IRR 1000ML POUR (IV SOLUTION) ×2 IMPLANT

## 2012-10-20 NOTE — MAU Note (Signed)
PT SAYS  SROM AT 5PM- CLEAR FLUID.   [facility] C/S ?  4-14 FOR LARGE ABY.  PT GEST DIABETIC- INSULIN DEPENDENT   VE YESTERDAY- 1 CM

## 2012-10-20 NOTE — MAU Note (Signed)
srom of clear fluid- still coming. No contractions

## 2012-10-20 NOTE — MAU Note (Signed)
Dr. Jean Rosenthal [facility] notified of high PPH risk

## 2012-10-20 NOTE — Anesthesia Preprocedure Evaluation (Addendum)
Anesthesia Evaluation  Patient identified by MRN, date of birth, ID band Patient awake    Reviewed: Allergy & Precautions, H&P , Patient's Chart, lab work & pertinent test results  Airway Mallampati: III TM Distance: >3 FB Neck ROM: full    Dental no notable dental hx.    Pulmonary asthma ,  breath sounds clear to auscultation  Pulmonary exam normal       Cardiovascular Exercise Tolerance: Good Rhythm:regular Rate:Normal     Neuro/Psych    GI/Hepatic   Endo/Other  diabetes, Gestational, Insulin DependentMorbid obesity  Renal/GU      Musculoskeletal   Abdominal   Peds  Hematology   Anesthesia Other Findings   Reproductive/Obstetrics                          Anesthesia Physical Anesthesia Plan  ASA: III  Anesthesia Plan: Combined Spinal and Epidural   Post-op Pain Management:    Induction:   Airway Management Planned:   Additional Equipment:   Intra-op Plan:   Post-operative Plan:   Informed Consent: I have reviewed the patients History and Physical, chart, labs and discussed the procedure including the risks, benefits and alternatives for the proposed anesthesia with the patient or authorized representative who has indicated his/her understanding and acceptance.     Plan Discussed with:   Anesthesia Plan Comments:        Anesthesia Quick Evaluation

## 2012-10-20 NOTE — MAU Note (Signed)
Fht's 140's per EFM

## 2012-10-20 NOTE — Progress Notes (Addendum)
ANTIBIOTIC CONSULT NOTE - INITIAL  Pharmacy Consult for : Gentamicin Indication: SROM  Allergies  Allergen Reactions  . Motrin (Ibuprofen) Other (See Comments)    Stomach pain  . Amoxicillin Rash  . Sulfa Antibiotics Rash    Patient Measurements: Height: 5\' 3"  (160 cm) Weight: 255 lb (115.667 kg) IBW/kg (Calculated) : 52.4 Adjusted Body Weight: : 71.3 kg  Vital Signs: Temp: 98.7 F (37.1 C) (03/29 1836) Temp src: Oral (03/29 1836) BP: 139/92 mmHg (03/29 1836) Pulse Rate: 103 (03/29 1836)    Labs:  Recent Labs  10/18/12 1720 10/20/12 1920  WBC 7.2  --   HGB 11.7*  --   PLT 133*  --   CREATININE 0.54 0.56   Estimated Creatinine Clearance: 128.4 ml/min (by C-G formula based on Cr of 0.56).   Microbiology: Recent Results (from the past 720 hour(s))  WET PREP, GENITAL     Status: Abnormal   Collection Time    09/21/12 12:10 PM      Result Value Range Status   Yeast Wet Prep HPF POC NONE SEEN  NONE SEEN Final   Trich, Wet Prep NONE SEEN  NONE SEEN Final   Clue Cells Wet Prep HPF POC FEW (*) NONE SEEN Final   WBC, Wet Prep HPF POC FEW (*) NONE SEEN Final   Comment: MANY BACTERIA SEEN  GC/CHLAMYDIA PROBE AMP     Status: None   Collection Time    09/21/12 12:10 PM      Result Value Range Status   CT Probe RNA NEGATIVE  NEGATIVE Final   GC Probe RNA NEGATIVE  NEGATIVE Final   Comment: (NOTE)                                                                                             Normal Reference Range: Negative          Assay performed using the Gen-Probe APTIMA COMBO2 (R) Assay.     Acceptable specimen types for this assay include APTIMA Swabs (Unisex,     endocervical, urethral, or vaginal), first void urine, and ThinPrep     liquid based cytology samples.  URINE CULTURE     Status: None   Collection Time    10/14/12  9:00 AM      Result Value Range Status   Specimen Description URINE, RANDOM   Final   Special Requests NONE   Final   Culture  Setup  Time 10/14/2012 18:05   Final   Colony Count 7,000 COLONIES/ML   Final   Culture INSIGNIFICANT GROWTH   Final   Report Status 10/15/2012 FINAL   Final  CULTURE, BETA STREP (GROUP B ONLY)     Status: None   Collection Time    10/14/12  9:30 AM      Result Value Range Status   Specimen Description VAGINAL/RECTAL   Final   Special Requests NONE   Final   Culture NO GROUP B STREP (S.AGALACTIAE) ISOLATED   Final   Report Status 10/16/2012 FINAL   Final  URINE CULTURE     Status: None   Collection Time  10/18/12  5:00 PM      Result Value Range Status   Specimen Description URINE, CLEAN CATCH STAT   Final   Special Requests NONE   Final   Culture  Setup Time 10/19/2012 01:22   Final   Colony Count 80,000 COLONIES/ML   Final   Culture     Final   Value: Multiple bacterial morphotypes present, none predominant. Suggest appropriate recollection if clinically indicated.   Report Status 10/20/2012 FINAL   Final     Medical History: Past Medical History  Diagnosis Date  . Complication of anesthesia 2011    Has ongoing shoulder and back pain from epidural  . Asthma     triggered by dust;colds;has inhaler prn  . Infection     Yeast;not frequent  . Infection     UTI;not frequent  . Chlamydia 2006    was treated  . Gonorrhea 2006    was treated  . PID (acute pelvic inflammatory disease) 2006    Antibxs;found out after + GC/CT  . Bipolar 1 disorder     Meds in the past  . Anxiety     Meds in the past  . Fibromyalgia   . Diabetes mellitus without complication   . Gestational diabetes   . Pregnancy induced hypertension   . Chronic kidney disease     kidney stones  . Urinary tract infection     Medications: Clindamycin 900mg  IV Q 8 hours.  Assessment: Pt. Is 36 6/[redacted] weeks gestation with SROM at 5pm today.  Pt. is allergic to amoxicillin (rash as reaction) & MD chose Gentamicin as an alternative antibiotic to be given prior to C-Section scheduled for tonight.   Goal of  Therapy: Peak ~ 6-7mg /L,  Trough < 1mg /L  Plan: (1) Start Gentamicin 190mg  IV with Clindamycin 900mg  IV prior to procedure. (2)  8 hours after load, give Gentamicin 180mg  IV Q8 hours (mixed w/ Clinda). (3) Will follow and adjust dose as needed.  Thank you,  Cathy Crounse Hovey-Rankin, Pharm. D. 10/20/2012,8:17 PM

## 2012-10-20 NOTE — H&P (Signed)
Megan Castaneda is a 27 y.o. female presenting for SROM at about 5pm, clear fluid grossly ruptured, +fern. Rare ctx, denies VB, reports GFM. Desires elective primary c/s for LGA. Pt is IDGDM, took insulin last at about 10am, last ate at about 230pm.  She denies HA/N/V/RUQ pain or blurry vision.   HPI: Pt began [facility] at 14wks at [facility], had several MAU visits prior to this. Had [place] from outside agency at 7wks w [facility] 4/17, which was inconsistent LMP, [place] at 14wks was c/w original 7wk [place].  Pt struggled w N/V throughout pregnancy, also w persistent back pain and has used flexeril, had 1 visit to PT.  Anatomy US at 18wks WNL c/w dating, tho some limited viewed, f/u US at 22wks WNL Growth [place] at 34wks noted polyhydramnios and LGA fetus >90% Further [place] were c/w LGA, polyhydramnios resolved, most recent EFW 4100g on 3/28  1hr gtt at 18wks was elevated, 3hr had 2 elevated values, pt had diabetic teaching and began checking CBG's which consistently remained elevated, glyburide was started and then increased to BID, without CBG improvement. At 29wks insulin was started with multiple dose changes due to continued CBG elevations particularly 2hr pp.   Pt was seen in MAU on 3/23 for PIH sx's, labs were WNL, 24 hour urine was also normal at that time.    Maternal Medical History:  Reason for admission: Rupture of membranes.  Nausea.  Contractions: Onset was 3-5 hours ago.   Frequency: rare.   Perceived severity is mild.    Fetal activity: Perceived fetal activity is normal.   Last perceived fetal movement was within the past hour.    Prenatal Complications - Diabetes: gestational. Diabetes is managed by insulin injections.      OB History   Grav Para Term Preterm Abortions TAB SAB Ect Mult Living   2 1 1       1      Past Medical History  Diagnosis Date  . Complication of anesthesia 2011    Has ongoing shoulder and back pain from epidural  . Asthma     triggered by dust;colds;has inhaler prn  .  Infection     Yeast;not frequent  . Infection     UTI;not frequent  . Chlamydia 2006    was treated  . Gonorrhea 2006    was treated  . PID (acute pelvic inflammatory disease) 2006    Antibxs;found out after + GC/CT  . Bipolar 1 disorder     Meds in the past  . Anxiety     Meds in the past  . Fibromyalgia   . Diabetes mellitus without complication   . Gestational diabetes   . Pregnancy induced hypertension   . Chronic kidney disease     kidney stones  . Urinary tract infection    Past Surgical History  Procedure Laterality Date  . Appendectomy  05/2008  . Wisdom tooth extraction  1999    All 4 removed   Family History: family history includes Anxiety disorder in her maternal grandmother and mother; Bipolar disorder in her maternal grandmother; Cancer in her mother; Dementia in her paternal grandfather and paternal grandmother; Depression in her maternal grandmother and mother; Diabetes in her maternal grandfather and maternal grandmother; Fibromyalgia in her maternal grandmother and mother; and Thyroid disease in her mother.  There is no history of Other. Social History:  reports that she quit smoking about 2 years ago. Her smoking use included Cigarettes. She smoked 0.00 packs per day.  She has never used smokeless tobacco. She reports that she uses illicit drugs (Marijuana) about twice per week. She reports that she does not drink alcohol.   Prenatal Transfer Tool  Maternal Diabetes: Yes:  Diabetes Type:  Insulin/Medication controlled, several insulin dose changes Genetic Screening: Normal Maternal Ultrasounds/Referrals: Abnormal:  Findings:   Other:polyhydramnios at 34wks, has resolved, LGA fetus EFW 4100g  Fetal Ultrasounds or other Referrals:  None Maternal Substance Abuse:  No Significant Maternal Medications:  Meds include: Zantac Other: flexeril, insulin  Significant Maternal Lab Results:  Lab values include: Group B Strep negative Other Comments:  None  Review of  Systems  Eyes: Negative for blurred vision.  Respiratory: Negative for shortness of breath.   Cardiovascular: Positive for leg swelling. Negative for chest pain.  Gastrointestinal: Positive for heartburn. Negative for nausea, vomiting and abdominal pain.  Genitourinary: Negative for dysuria.  Musculoskeletal: Positive for back pain.  Neurological: Negative for headaches.  All other systems reviewed and are negative.      Blood pressure 139/92, pulse 103, temperature 98.7 F (37.1 C), temperature source Oral, resp. rate 20, height 5\' 3"  (1.6 m), weight 255 lb (115.667 kg), last menstrual period 01/18/2012. Maternal Exam:  Uterine Assessment: Contraction strength is mild.  Contraction frequency is rare.   Abdomen: Patient reports no abdominal tenderness. Fundal height is S>D.   Estimated fetal weight is 9# .   Fetal presentation: vertex  Introitus: Normal vulva. Normal vagina.  Ferning test: positive.  Amniotic fluid character: clear. Leaking copious amt of clear fluid   Pelvis: questionable for delivery.   Cervix: Cervix evaluated by digital exam.   VE=1cm, no PP in pelvis   Fetal Exam Fetal Monitor Review: Mode: ultrasound.   Baseline rate: 140.  Variability: moderate (6-25 bpm).   Pattern: no decelerations and no accelerations.    Fetal State Assessment: Category II - tracings are indeterminate.     Physical Exam  Nursing note and vitals reviewed. Constitutional: She is oriented to person, place, and time. She appears well-developed and well-nourished.  HENT:  Head: Normocephalic.  Eyes: Pupils are equal, round, and reactive to light.  Neck: Normal range of motion.  Cardiovascular: Normal rate, regular rhythm and normal heart sounds.   Respiratory: Effort normal and breath sounds normal.  GI: Soft. Bowel sounds are normal.  Genitourinary: Vagina normal.  Grossly ruptured, clear fluid, no PP in pelvis, cervix 1cm   Musculoskeletal: Normal range of motion. She  exhibits edema. She exhibits no tenderness.  Neurological: She is alert and oriented to person, place, and time. She has normal reflexes.  Neg clonus bilaterally   Skin: Skin is warm and dry.  Psychiatric: She has a normal mood and affect. Her behavior is normal.    Prenatal labs: ABO, Rh: A/POS/-- (10/02 1055) Antibody: NEG (10/02 1055) Rubella: 85.3 (10/02 1055) RPR: NON REAC (10/02 1055)  HBsAg: NEGATIVE (10/02 1055)  HIV: NON REACTIVE (10/02 1055)  GBS:   neg  Elevated 1hr gtt at 18wks, elevated 3hr,  Quad screen WNL GC/CT neg   Assessment/Plan: IUP at [redacted]w[redacted]d SROM, not in labor, pp not in pelvis  LGA fetus, EFW 4100g on 3/28 IDGDM, initially not well controlled Elevated BP's FHR reassuring, but initially  Not reactive Pt desires elective primary c-section - had discussed at OV this week R/B rv'd w pt including but not limited to 1. Infection 2. Bleeding 3. Damage to internal organs 4. Anesthesia complications   Admit to pre-op per c/w Dr Normand Sloop Routine pre-op  orders for c/s  Check PIH labs and send UA after foley placed and begin 24 hour urine collection Check CBG now protonix IV x1 now for heartburn Will give clindamycin and gentamycin per pharmacy dosing prior to surgery secondary to pt allergy to amoxicillin Will consult about use of IV toradol (pt's allergy to motrin is abdominal pain)  Will plan for c/s at approximately 2230, (pt last ate at 1430)     Verdell Dykman M 10/20/2012, 8:14 PM

## 2012-10-20 NOTE — Anesthesia Procedure Notes (Signed)
Spinal  Patient location during procedure: OR Preanesthetic Checklist Completed: patient identified, site marked, surgical consent, pre-op evaluation, timeout performed, IV checked, risks and benefits discussed and monitors and equipment checked Spinal Block Patient position: sitting Prep: DuraPrep Patient monitoring: cardiac monitor, continuous pulse ox, blood pressure and heart rate Approach: midline Location: L3-4 Injection technique: catheter Needle Needle type: Tuohy and Sprotte  Needle gauge: 24 G Needle length: 12.7 cm Needle insertion depth: 7 cm Catheter type: closed end flexible Catheter size: 19 g Catheter at skin depth: 14 cm Assessment Sensory level: T6 Additional Notes Spinal Dosage in OR  Bupivicaine ml       1.3 PFMS04   mcg        150

## 2012-10-21 ENCOUNTER — Encounter (HOSPITAL_COMMUNITY): Payer: Self-pay | Admitting: *Deleted

## 2012-10-21 LAB — COMPREHENSIVE METABOLIC PANEL
AST: 20 U/L (ref 0–37)
Albumin: 2.1 g/dL — ABNORMAL LOW (ref 3.5–5.2)
BUN: 9 mg/dL (ref 6–23)
Calcium: 9.2 mg/dL (ref 8.4–10.5)
Creatinine, Ser: 0.66 mg/dL (ref 0.50–1.10)
Total Protein: 5.2 g/dL — ABNORMAL LOW (ref 6.0–8.3)

## 2012-10-21 LAB — CBC
HCT: 33.9 % — ABNORMAL LOW (ref 36.0–46.0)
Hemoglobin: 11.6 g/dL — ABNORMAL LOW (ref 12.0–15.0)
MCH: 30.9 pg (ref 26.0–34.0)
MCHC: 34.2 g/dL (ref 30.0–36.0)
RBC: 3.75 MIL/uL — ABNORMAL LOW (ref 3.87–5.11)

## 2012-10-21 LAB — GLUCOSE, CAPILLARY
Glucose-Capillary: 55 mg/dL — ABNORMAL LOW (ref 70–99)
Glucose-Capillary: 61 mg/dL — ABNORMAL LOW (ref 70–99)
Glucose-Capillary: 71 mg/dL (ref 70–99)

## 2012-10-21 LAB — LACTATE DEHYDROGENASE: LDH: 244 U/L (ref 94–250)

## 2012-10-21 LAB — ABO/RH: ABO/RH(D): A POS

## 2012-10-21 LAB — URIC ACID: Uric Acid, Serum: 5.4 mg/dL (ref 2.4–7.0)

## 2012-10-21 MED ORDER — PRENATAL MULTIVITAMIN CH
1.0000 | ORAL_TABLET | Freq: Every day | ORAL | Status: DC
  Administered 2012-10-21 – 2012-10-23 (×3): 1 via ORAL
  Filled 2012-10-21 (×3): qty 1

## 2012-10-21 MED ORDER — FENTANYL CITRATE 0.05 MG/ML IJ SOLN
INTRAMUSCULAR | Status: DC | PRN
  Administered 2012-10-21 (×2): 50 ug via INTRAVENOUS

## 2012-10-21 MED ORDER — SODIUM CHLORIDE 0.9 % IJ SOLN: 3.0000 mL | INTRAMUSCULAR | Status: DC | PRN

## 2012-10-21 MED ORDER — KETOROLAC TROMETHAMINE 60 MG/2ML IM SOLN
60.0000 mg | Freq: Once | INTRAMUSCULAR | Status: AC | PRN
  Filled 2012-10-21: qty 2

## 2012-10-21 MED ORDER — DIPHENHYDRAMINE HCL 25 MG PO CAPS
25.0000 mg | ORAL_CAPSULE | ORAL | Status: DC | PRN
  Filled 2012-10-21: qty 1

## 2012-10-21 MED ORDER — ONDANSETRON HCL 4 MG PO TABS: 4.0000 mg | ORAL_TABLET | ORAL | Status: DC | PRN

## 2012-10-21 MED ORDER — LANOLIN HYDROUS EX OINT: 1.0000 "application " | TOPICAL_OINTMENT | CUTANEOUS | Status: DC | PRN

## 2012-10-21 MED ORDER — DIPHENHYDRAMINE HCL 50 MG/ML IJ SOLN: 12.5000 mg | INTRAMUSCULAR | Status: DC | PRN

## 2012-10-21 MED ORDER — SCOPOLAMINE 1 MG/3DAYS TD PT72
1.0000 | MEDICATED_PATCH | Freq: Once | TRANSDERMAL | Status: DC
  Administered 2012-10-21: 1.5 mg via TRANSDERMAL

## 2012-10-21 MED ORDER — MEPERIDINE HCL 25 MG/ML IJ SOLN: 6.2500 mg | INTRAMUSCULAR | Status: DC | PRN

## 2012-10-21 MED ORDER — TETANUS-DIPHTH-ACELL PERTUSSIS 5-2.5-18.5 LF-MCG/0.5 IM SUSP: 0.5000 mL | Freq: Once | INTRAMUSCULAR | Status: DC

## 2012-10-21 MED ORDER — DIPHENHYDRAMINE HCL 25 MG PO CAPS
25.0000 mg | ORAL_CAPSULE | Freq: Four times a day (QID) | ORAL | Status: DC | PRN
  Administered 2012-10-21 (×2): 25 mg via ORAL
  Filled 2012-10-21: qty 1

## 2012-10-21 MED ORDER — INSULIN ASPART 100 UNIT/ML ~~LOC~~ SOLN
0.0000 [IU] | Freq: Three times a day (TID) | SUBCUTANEOUS | Status: DC
Start: 2012-10-21 — End: 2012-10-23

## 2012-10-21 MED ORDER — KETOROLAC TROMETHAMINE 30 MG/ML IJ SOLN
30.0000 mg | Freq: Once | INTRAMUSCULAR | Status: AC
  Administered 2012-10-21: 30 mg via INTRAVENOUS
  Filled 2012-10-21: qty 1

## 2012-10-21 MED ORDER — SIMETHICONE 80 MG PO CHEW: 80.0000 mg | CHEWABLE_TABLET | ORAL | Status: DC | PRN

## 2012-10-21 MED ORDER — KETOROLAC TROMETHAMINE 30 MG/ML IJ SOLN
30.0000 mg | Freq: Once | INTRAMUSCULAR | Status: AC
  Administered 2012-10-22: 30 mg via INTRAVENOUS
  Filled 2012-10-21: qty 1

## 2012-10-21 MED ORDER — ONDANSETRON HCL 4 MG/2ML IJ SOLN: 4.0000 mg | Freq: Three times a day (TID) | INTRAMUSCULAR | Status: DC | PRN

## 2012-10-21 MED ORDER — LACTATED RINGERS IV SOLN
INTRAVENOUS | Status: DC
  Administered 2012-10-21 (×2): via INTRAVENOUS

## 2012-10-21 MED ORDER — NALOXONE HCL 0.4 MG/ML IJ SOLN: 0.4000 mg | INTRAMUSCULAR | Status: DC | PRN

## 2012-10-21 MED ORDER — SIMETHICONE 80 MG PO CHEW
80.0000 mg | CHEWABLE_TABLET | Freq: Three times a day (TID) | ORAL | Status: DC
  Administered 2012-10-21 – 2012-10-23 (×7): 80 mg via ORAL

## 2012-10-21 MED ORDER — SENNOSIDES-DOCUSATE SODIUM 8.6-50 MG PO TABS
2.0000 | ORAL_TABLET | Freq: Every day | ORAL | Status: DC
  Administered 2012-10-21 – 2012-10-22 (×2): 2 via ORAL

## 2012-10-21 MED ORDER — OXYCODONE-ACETAMINOPHEN 5-325 MG PO TABS
1.0000 | ORAL_TABLET | ORAL | Status: DC | PRN
  Administered 2012-10-21: 1 via ORAL
  Administered 2012-10-21 – 2012-10-23 (×8): 2 via ORAL
  Administered 2012-10-23: 1 via ORAL
  Filled 2012-10-21 (×6): qty 2
  Filled 2012-10-21: qty 1
  Filled 2012-10-21 (×2): qty 2
  Filled 2012-10-21: qty 1

## 2012-10-21 MED ORDER — FERROUS SULFATE 325 (65 FE) MG PO TABS
325.0000 mg | ORAL_TABLET | Freq: Two times a day (BID) | ORAL | Status: DC
  Administered 2012-10-21 – 2012-10-23 (×4): 325 mg via ORAL
  Filled 2012-10-21 (×4): qty 1

## 2012-10-21 MED ORDER — KETOROLAC TROMETHAMINE 30 MG/ML IJ SOLN: 30.0000 mg | Freq: Four times a day (QID) | INTRAMUSCULAR | Status: AC | PRN

## 2012-10-21 MED ORDER — MEASLES, MUMPS & RUBELLA VAC ~~LOC~~ INJ
0.5000 mL | INJECTION | Freq: Once | SUBCUTANEOUS | Status: DC
  Filled 2012-10-21: qty 0.5

## 2012-10-21 MED ORDER — METOCLOPRAMIDE HCL 5 MG/ML IJ SOLN: 10.0000 mg | Freq: Three times a day (TID) | INTRAMUSCULAR | Status: DC | PRN

## 2012-10-21 MED ORDER — NALOXONE HCL 1 MG/ML IJ SOLN
1.0000 ug/kg/h | INTRAVENOUS | Status: DC | PRN
  Filled 2012-10-21: qty 2

## 2012-10-21 MED ORDER — MENTHOL 3 MG MT LOZG: 1.0000 | LOZENGE | OROMUCOSAL | Status: DC | PRN

## 2012-10-21 MED ORDER — DIBUCAINE 1 % RE OINT: 1.0000 "application " | TOPICAL_OINTMENT | RECTAL | Status: DC | PRN

## 2012-10-21 MED ORDER — FLEET ENEMA 7-19 GM/118ML RE ENEM: 1.0000 | ENEMA | Freq: Every day | RECTAL | Status: DC | PRN

## 2012-10-21 MED ORDER — NALBUPHINE HCL 10 MG/ML IJ SOLN
5.0000 mg | INTRAMUSCULAR | Status: DC | PRN
  Filled 2012-10-21: qty 1

## 2012-10-21 MED ORDER — WITCH HAZEL-GLYCERIN EX PADS: 1.0000 "application " | MEDICATED_PAD | CUTANEOUS | Status: DC | PRN

## 2012-10-21 MED ORDER — FENTANYL CITRATE 0.05 MG/ML IJ SOLN: 25.0000 ug | INTRAMUSCULAR | Status: DC | PRN

## 2012-10-21 MED ORDER — NALBUPHINE SYRINGE 5 MG/0.5 ML
5.0000 mg | INJECTION | INTRAMUSCULAR | Status: DC | PRN
Start: 2012-10-21 — End: 2012-10-23
  Filled 2012-10-21: qty 1

## 2012-10-21 MED ORDER — OXYTOCIN 40 UNITS IN LACTATED RINGERS INFUSION - SIMPLE MED: 62.5000 mL/h | INTRAVENOUS | Status: AC

## 2012-10-21 MED ORDER — ZOLPIDEM TARTRATE 5 MG PO TABS: 5.0000 mg | ORAL_TABLET | Freq: Every evening | ORAL | Status: DC | PRN

## 2012-10-21 MED ORDER — CYCLOBENZAPRINE HCL 5 MG PO TABS
5.0000 mg | ORAL_TABLET | Freq: Three times a day (TID) | ORAL | Status: DC | PRN
  Filled 2012-10-21: qty 1

## 2012-10-21 MED ORDER — ONDANSETRON HCL 4 MG/2ML IJ SOLN
4.0000 mg | INTRAMUSCULAR | Status: DC | PRN
  Administered 2012-10-21: 4 mg via INTRAVENOUS
  Filled 2012-10-21: qty 2

## 2012-10-21 MED ORDER — DIPHENHYDRAMINE HCL 50 MG/ML IJ SOLN: 25.0000 mg | INTRAMUSCULAR | Status: DC | PRN

## 2012-10-21 MED ORDER — BISACODYL 10 MG RE SUPP: 10.0000 mg | Freq: Every day | RECTAL | Status: DC | PRN

## 2012-10-21 MED ORDER — GLYBURIDE 5 MG PO TABS
10.0000 mg | ORAL_TABLET | Freq: Two times a day (BID) | ORAL | Status: DC
  Administered 2012-10-21: 10 mg via ORAL
  Filled 2012-10-21: qty 2

## 2012-10-21 MED ORDER — PNEUMOCOCCAL VAC POLYVALENT 25 MCG/0.5ML IJ INJ
0.5000 mL | INJECTION | INTRAMUSCULAR | Status: AC
  Filled 2012-10-21: qty 0.5

## 2012-10-21 MED ORDER — SCOPOLAMINE 1 MG/3DAYS TD PT72
MEDICATED_PATCH | TRANSDERMAL | Status: AC
  Filled 2012-10-21: qty 1

## 2012-10-21 NOTE — Op Note (Signed)
Cesarean Section Procedure Note   Megan Castaneda  10/20/2012 - 10/21/2012  Indications: Scheduled Proceedure/Maternal Request and PROM and macrosomia   Pre-operative Diagnosis: macrosomnia, rupture of membranes,  scheduled cesarean section.   Post-operative Diagnosis: Same   Surgeon: Surgeon(s) and Role:    * Michael Litter, MD - Primary   Assistants: Gevena Barre CNM  Anesthesia: spinal   Procedure Details:  The patient was seen in the Holding Room. The risks, benefits, complications, treatment options, and expected outcomes were discussed with the patient. The patient concurred with the proposed plan, giving informed consent. identified as [name] and the procedure verified as C-Section Delivery. A Time Out was held and the above information confirmed.  After induction of anesthesia, the patient was draped and prepped in the usual sterile manner. A transverse incision was made and carried down through the subcutaneous tissue to the fascia. Fascial incision was made in the midline and extended transversely. The fascia was separated from the underlying rectus muscle superiorly and inferiorly. The peritoneum was identified and entered. Peritoneal incision was extended longitudinally with good visualization of bowel and bladder. The utero-vesical peritoneal reflection was incised transversely and the bladder flap was bluntly freed from the lower uterine segment.  An alexsis retractor was placed in the abdomen. A moist laparotomy sponge was placed in the pericolic gutter to pack away the bowel coming into the incision.    A low transverse uterine incision was made. Delivered from cephalic presentation was a  infant, with Apgar scores of 7 at one minute and 9 at five minutes. Cord ph was not sent the umbilical cord was clamped and cut cord blood was obtained for evaluation. The placenta was removed Intact and appeared normal. The uterine outline, tubes and ovaries appeared normal}. The uterine  incision was closed with running locked sutures of 0Vicryl. A second layer 0 vicrlyl was used to imbricate the uterine incision    Hemostasis was observed. Lavage was carried out until clear. The alexsis was removed along with the packing.  The fish was placed and  The peritoneum was closed with 0 chromic. The fish was removed.    The muscles were examined and any bleeders were made hemostatic using bovie cautery device.   The fascia was then reapproximated with running sutures of 0 vicryl.  A size 10 JP drain was placed in the subcutaneous tissue.   The subcutaneous tissue was reapproximated  With interrupted stitches using 2-0 plain gut. The subcuticular closure was performed using 3-26monocryl     Instrument, sponge, and needle counts were correct prior the abdominal closure and were correct at the conclusion of the case.    Findings: infant was delivered from vtx presentation. The fluid was clear.  The uterus tubes and ovaries appeared normal.     Estimated Blood Loss:  Total IV Fluids:   Urine Output: 200CC OF clear urine  Specimens: placenta to pathology  Complications: no complications  Disposition: PACU - hemodynamically stable.   Maternal Condition: stable   Baby condition / location:  nursery-stable  Attending Attestation: I was present and scrubbed for the entire procedure.   Signed: Surgeon(s): Michael Litter, MD

## 2012-10-21 NOTE — Anesthesia Postprocedure Evaluation (Signed)
  Anesthesia Post-op Note  Patient: Megan Castaneda  Procedure(s) Performed: Procedure(s): Primary CESAREAN SECTION  of baby boy  at 2336 APGAR 7/9 (N/A)   Patient is awake, responsive, moving her legs, and has signs of resolution of her numbness. Pain and nausea are reasonably well controlled. Vital signs are stable and clinically acceptable. Oxygen saturation is clinically acceptable. There are no apparent anesthetic complications at this time. Patient is ready for discharge.

## 2012-10-21 NOTE — Progress Notes (Signed)
CRITICAL VALUE ALERT  Critical value received: CBG 57  Hypoglycemic protocol initiated, recheck CBG 15 minutes  Date of notification:  10/21/2012  Time of notification: 1910  Critical value read back:no  Nurse who received alert:  Geronimo Running, RN  MD notified (1st page):    Time of first page:   MD notified (2nd page):  Time of second page:  Responding MD:   Time MD responded:

## 2012-10-21 NOTE — Progress Notes (Signed)
Hypoglycemic protocol completed, 1940 CBG-71. Patient IV saline locked, catheter emptied of 450 ml clear urine into 24 hour collection. Patient really wants to walk around unit. CNM notified and updated on low 2 hr PP CBG's from today-67,75 after juice given following protocol (breakfast). Lunch at 1700-57, followed by 61, 71 after juices given according to protocol. Continue 2 hr PP blood sugars following any meals and also wanting a fasting CBG in the morning.

## 2012-10-21 NOTE — Progress Notes (Signed)
Pt c/o of abdominal pain rates pain 6 out 10. Dr. Jean Rosenthal noitfied orders given to give Toradol 30 mg IV now then repeat in 30 min  Toradol 30 mg if needed. MD states he is aware of  Motrin allergy and plattlet count. Orders given to proceed. Will continue to monitor.

## 2012-10-21 NOTE — Progress Notes (Signed)
CRITICAL VALUE ALERT  Critical value received:  CBG 67 after 4oz juice; give another 4oz juice and repeat CBG    Date of notification:  10/21/2012  Time of notification:  1215  Critical value read back:  Nurse who received alert:  Geronimo Running, RN  MD notified (1st page):  Alonna Minium  Time of first page:  1215  MD notified (2nd page):  Time of second page:  Responding MD:    Time MD responded:

## 2012-10-21 NOTE — OR Nursing (Signed)
75 ml blood loss during fundal massage by DLWegner RN, cord blood x 2 to OR front desk 

## 2012-10-21 NOTE — Anesthesia Postprocedure Evaluation (Signed)
Anesthesia Post Note  Patient: Megan Castaneda  Procedure(s) Performed: Procedure(s): Primary CESAREAN SECTION  of baby boy  at 2336 APGAR 7/9 (N/A)  Anesthesia type: Epidural  Patient location: Mother/Baby  Post pain: Pain level controlled  Post assessment: Post-op Vital signs reviewed  Last Vitals: BP 157/93  Pulse 116  Temp(Src) 37 C (Oral)  Resp 20  Ht 5\' 3"  (1.6 m)  Wt 255 lb (115.667 kg)  BMI 45.18 kg/m2  SpO2 96%  LMP 01/18/2012  Post vital signs: Reviewed  Level of consciousness: awake  Complications: No apparent anesthesia complications

## 2012-10-21 NOTE — Progress Notes (Signed)
CSW has attempted x3 today to see MOB for hx of anxiety and Bipolar. CSW will pass referral on to weekday CSW to assess.  [phone]

## 2012-10-21 NOTE — Progress Notes (Signed)
CRITICAL VALUE ALERT  Critical value received:  CBG 55 @ 1200 (2 hours after eating lunch)  Started hypoglycemic protocol  Date of notification:  10/21/2012  Time of notification:  1200  Critical value read back:  Nurse who received alert:  Geronimo Running, RN  MD notified (1st page):  Time of first page:   MD notified (2nd page):  Time of second page  Responding MD:    Time MD responded:

## 2012-10-21 NOTE — Transfer of Care (Signed)
Immediate Anesthesia Transfer of Care Note  Patient: Megan Castaneda  Procedure(s) Performed: Procedure(s): Primary CESAREAN SECTION  of baby boy  at 2336 APGAR 7/9 (N/A)  Patient Location: PACU  Anesthesia Type:Spinal and Epidural  Level of Consciousness: awake, alert  and oriented  Airway & Oxygen Therapy: Patient Spontanous Breathing and Patient connected to nasal cannula oxygen  Post-op Assessment: Report given to PACU RN  Post vital signs: Reviewed  Complications: No apparent anesthesia complications

## 2012-10-21 NOTE — Progress Notes (Signed)
CRITICAL VALUE ALERT  Critical value received:  26  Date of notification:  10/21/12  Time of notification: 1925  Critical value read back:no  Nurse who received alert:  Cox, RN  MD notified (1st page):  2020  Time of first page:  2020  MD notified (2nd page):  Time of second page:  Responding MD:    Time MD responded:

## 2012-10-21 NOTE — Progress Notes (Signed)
Subjective: Postpartum Day 1: s/p primary Cesarean Delivery for suspected macrosomia RN just called to report cbg=55; started hypoglycemia protocol.  Pt c/o Lt hand feeling knumb.  No flatus yet.  Foley remains in place to complete 24 hr urine.  No PIH s/s.  Working on [employer]; didn't BF older child.  Patient reports tolerating PO today, but n/v earlier.  Eating hashbrowns, eggs w/ cheeze, activia, and juice when in her room earlier today.    Objective: Vital signs in last 24 hours: Temp:  [97.5 F (36.4 C)-98.7 F (37.1 C)] 98.6 F (37 C) (03/30 1000) Pulse Rate:  [92-132] 101 (03/30 1000) Resp:  [14-31] 16 (03/30 1000) BP: (117-157)/(57-93) 141/86 mmHg (03/30 1000) SpO2:  [93 %-98 %] 97 % (03/30 1000) Weight:  [255 lb (115.667 kg)] 255 lb (115.667 kg) (03/29 1836) .Marland KitchenI/O last 3 completed shifts: In: 3130.4 [P.O.:120; I.V.:3010.4] Out: 1410 [Urine:375; Drains:60; Other:175; Blood:800] Total I/O In: -  Out: 20 [Drains:20] .Marland Kitchen Filed Vitals:   10/21/12 0800 10/21/12 0803 10/21/12 0806 10/21/12 1000  BP: 139/90 148/91 143/69 141/86  Pulse: 99 103 117 101  Temp: 98.3 F (36.8 C)   98.6 F (37 C)  TempSrc: Oral   Oral  Resp: 18   16  Height:      Weight:      SpO2: 95%   97%  .Marland Kitchen CBG (last 3)   Recent Labs  10/21/12 0055 10/21/12 0829 10/21/12 1154  GLUCAP 96 78 55*     Physical Exam:  General: alert, cooperative, appears stated age, no distress and moderately obese Abd: soft, appropriately tender Lochia: appropriate, rubra Uterine Fundus: firm, below umbilicus;  Incision: no significant drainage, honeycomb dsg intact; JP w/ small amt of serosang drainage DVT Evaluation: No evidence of DVT seen on physical exam. Negative Homan's sign. Calf/Ankle edema is present. .. Results for orders placed during the hospital encounter of 10/20/12 (from the past 24 hour(s))  GLUCOSE, CAPILLARY     Status: Abnormal   Collection Time    10/20/12  7:19 PM      Result Value Range   Glucose-Capillary 111 (*) 70 - 99 mg/dL   Comment 1 Documented in Chart    COMPREHENSIVE METABOLIC PANEL     Status: Abnormal   Collection Time    10/20/12  7:20 PM      Result Value Range   Sodium 137  135 - 145 mEq/L   Potassium 3.5  3.5 - 5.1 mEq/L   Chloride 103  96 - 112 mEq/L   CO2 21  19 - 32 mEq/L   Glucose, Bld 121 (*) 70 - 99 mg/dL   BUN 8  6 - 23 mg/dL   Creatinine, Ser 1.61  0.50 - 1.10 mg/dL   Calcium 9.3  8.4 - 09.6 mg/dL   Total Protein 5.4 (*) 6.0 - 8.3 g/dL   Albumin 2.3 (*) 3.5 - 5.2 g/dL   AST 18  0 - 37 U/L   ALT 12  0 - 35 U/L   Alkaline Phosphatase 173 (*) 39 - 117 U/L   Total Bilirubin 0.3  0.3 - 1.2 mg/dL   GFR calc non Af Amer >90  >90 mL/min   GFR calc Af Amer >90  >90 mL/min  LACTATE DEHYDROGENASE     Status: None   Collection Time    10/20/12  7:20 PM      Result Value Range   LDH 172  94 - 250 U/L  URIC ACID  Status: None   Collection Time    10/20/12  7:20 PM      Result Value Range   Uric Acid, Serum 5.6  2.4 - 7.0 mg/dL  CBC     Status: Abnormal   Collection Time    10/20/12  7:20 PM      Result Value Range   WBC 8.0  4.0 - 10.5 K/uL   RBC 3.66 (*) 3.87 - 5.11 MIL/uL   Hemoglobin 11.4 (*) 12.0 - 15.0 g/dL   HCT 78.2 (*) 95.6 - 21.3 %   MCV 90.4  78.0 - 100.0 fL   MCH 31.1  26.0 - 34.0 pg   MCHC 34.4  30.0 - 36.0 g/dL   RDW 08.6  57.8 - 46.9 %   Platelets 133 (*) 150 - 400 K/uL  ABO/RH     Status: None   Collection Time    10/20/12  7:20 PM      Result Value Range   ABO/RH(D) A POS    TYPE AND SCREEN     Status: None   Collection Time    10/20/12  7:45 PM      Result Value Range   ABO/RH(D) A POS     Antibody Screen NEG     Sample Expiration 10/23/2012    GLUCOSE, CAPILLARY     Status: None   Collection Time    10/20/12  9:30 PM      Result Value Range   Glucose-Capillary 94  70 - 99 mg/dL  GLUCOSE, CAPILLARY     Status: None   Collection Time    10/21/12 12:55 AM      Result Value Range   Glucose-Capillary 96  70 -  99 mg/dL  LACTATE DEHYDROGENASE     Status: None   Collection Time    10/21/12  6:55 AM      Result Value Range   LDH 244  94 - 250 U/L  COMPREHENSIVE METABOLIC PANEL     Status: Abnormal   Collection Time    10/21/12  6:55 AM      Result Value Range   Sodium 138  135 - 145 mEq/L   Potassium 4.0  3.5 - 5.1 mEq/L   Chloride 104  96 - 112 mEq/L   CO2 24  19 - 32 mEq/L   Glucose, Bld 88  70 - 99 mg/dL   BUN 9  6 - 23 mg/dL   Creatinine, Ser 6.29  0.50 - 1.10 mg/dL   Calcium 9.2  8.4 - 52.8 mg/dL   Total Protein 5.2 (*) 6.0 - 8.3 g/dL   Albumin 2.1 (*) 3.5 - 5.2 g/dL   AST 20  0 - 37 U/L   ALT 10  0 - 35 U/L   Alkaline Phosphatase 153 (*) 39 - 117 U/L   Total Bilirubin 0.4  0.3 - 1.2 mg/dL   GFR calc non Af Amer >90  >90 mL/min   GFR calc Af Amer >90  >90 mL/min  URIC ACID     Status: None   Collection Time    10/21/12  6:55 AM      Result Value Range   Uric Acid, Serum 5.4  2.4 - 7.0 mg/dL  CBC     Status: Abnormal   Collection Time    10/21/12  6:55 AM      Result Value Range   WBC 14.5 (*) 4.0 - 10.5 K/uL   RBC 3.75 (*) 3.87 - 5.11 MIL/uL  Hemoglobin 11.6 (*) 12.0 - 15.0 g/dL   HCT 16.1 (*) 09.6 - 04.5 %   MCV 90.4  78.0 - 100.0 fL   MCH 30.9  26.0 - 34.0 pg   MCHC 34.2  30.0 - 36.0 g/dL   RDW 40.9  81.1 - 91.4 %   Platelets 118 (*) 150 - 400 K/uL  GLUCOSE, CAPILLARY     Status: None   Collection Time    10/21/12  8:29 AM      Result Value Range   Glucose-Capillary 78  70 - 99 mg/dL  GLUCOSE, CAPILLARY     Status: Abnormal   Collection Time    10/21/12 11:54 AM      Result Value Range   Glucose-Capillary 55 (*) 70 - 99 mg/dL   Comment 1 Documented in Chart      Assessment/Plan: Status post Cesarean section. Postoperative course complicated by hypoglycemia  Urinary output=30cc for AM shift.  Will leave LR running in addition to pt's po intake.   D/c'd pt's glyburide (was 10mg  po bid; had morning dose). Disc'd diet at length and rec'd BF'ng to help w/ wt  loss, in add'n to numerous benefits to baby as well. Continue current care; will update dr. Su Hilt and will CTO cbg's, urinary output, and status closely.  Sabastian Raimondi H 10/21/2012, 12:25 PM

## 2012-10-22 ENCOUNTER — Encounter (HOSPITAL_COMMUNITY): Payer: Self-pay | Admitting: Obstetrics and Gynecology

## 2012-10-22 LAB — URIC ACID: Uric Acid, Serum: 5.2 mg/dL (ref 2.4–7.0)

## 2012-10-22 LAB — PROTEIN, URINE, 24 HOUR
Protein, Urine: <3 mg/dL
Urine Total Volume-UPROT: 4920 mL

## 2012-10-22 LAB — GLUCOSE, CAPILLARY
Glucose-Capillary: 101 mg/dL — ABNORMAL HIGH (ref 70–99)
Glucose-Capillary: 102 mg/dL — ABNORMAL HIGH (ref 70–99)
Glucose-Capillary: 68 mg/dL — ABNORMAL LOW (ref 70–99)
Glucose-Capillary: 72 mg/dL (ref 70–99)
Glucose-Capillary: 77 mg/dL (ref 70–99)

## 2012-10-22 LAB — COMPREHENSIVE METABOLIC PANEL
ALT: 15 U/L (ref 0–35)
Albumin: 2 g/dL — ABNORMAL LOW (ref 3.5–5.2)
Alkaline Phosphatase: 141 U/L — ABNORMAL HIGH (ref 39–117)
BUN: 11 mg/dL (ref 6–23)
Chloride: 102 mEq/L (ref 96–112)
Glucose, Bld: 64 mg/dL — ABNORMAL LOW (ref 70–99)
Potassium: 3.9 mEq/L (ref 3.5–5.1)
Sodium: 138 mEq/L (ref 135–145)
Total Bilirubin: 0.3 mg/dL (ref 0.3–1.2)
Total Protein: 5 g/dL — ABNORMAL LOW (ref 6.0–8.3)

## 2012-10-22 NOTE — Progress Notes (Signed)
Concerned that patient may drop and be hypoglycemic-did rounding and patient was asleep. Arousable with noise and stated my name and that she wasn't getting enough sleep, then starting snoring again. Emptied catheter of 450 ml and added more ice to bin for 24 hr urine specimen.

## 2012-10-22 NOTE — Progress Notes (Signed)
CRITICAL VALUE ALERT  Critical value received:  57, repeat-68  Date of notification:  10/22/12  Time of notification:  0620  Critical value read back:no  Nurse who received alert:  Hart Rochester, RN  MD notified (1st page):  hypoglcemic protocol initiated given 4 oz apple juice  Time of first page:    MD notified (2nd page):  Time of second page:  Responding MD:    Time MD responded:

## 2012-10-22 NOTE — Progress Notes (Signed)
CSW attempted to assess pt's history of mental illnesses & MJ use but visitors present.  Pt asked CSW to return at later time.

## 2012-10-22 NOTE — Progress Notes (Signed)
UR chart review completed.  

## 2012-10-22 NOTE — Progress Notes (Signed)
Subjective: Postpartum Day 2: Cesarean Delivery Patient reports incisional pain, tolerating PO, + flatus and no problems voiding.  Denies Ha, visual changes or ruq pain.  No lightheadedness or dizziness or other symptoms when sugars have been low.  Plans outpt circ.  Trying to breastfeed.  Objective: Vital signs in last 24 hours: Temp:  [97.5 F (36.4 C)-99.5 F (37.5 C)] 99.5 F (37.5 C) (03/31 0005) Pulse Rate:  [98-118] 118 (03/31 0005) Resp:  [18-20] 20 (03/31 0005) BP: (126-132)/(79-85) 126/85 mmHg (03/31 0005) SpO2:  [94 %-95 %] 95 % (03/30 1700)  Physical Exam:  General: alert and no distress Lochia: appropriate Uterine Fundus: firm Incision: c/d/dressing intact with JP drain in place. DVT Evaluation: No evidence of DVT seen on physical exam.   Recent Labs  10/20/12 1920 10/21/12 0655  HGB 11.4* 11.6*  HCT 33.1* 33.9*    Assessment/Plan: Status post Cesarean section. Postoperative course complicated by asymptomatic hypoglycemia.  Will continue to observe and do CBGs 4x/day. Pt has had occasional elevated BPs but mostly they are ok.  Will continue to observe. 24 hr urine is pending. Cont routine postop course  Megan Castaneda Y 10/22/2012, 10:39 AM

## 2012-10-22 NOTE — Progress Notes (Signed)
CRITICAL VALUE ALERT  Critical value received:  53  Date of notification:  10/22/12  Time of notification:  0555  Critical value read back:no  Nurse who received alert:  N.Hart Rochester, RN  MD notified (1st page): hypoglemic protocol initiated  Time of first page:    MD notified (2nd page):  Time of second page:  Responding MD:   Time MD responded:

## 2012-10-23 LAB — COMPREHENSIVE METABOLIC PANEL
Alkaline Phosphatase: 117 U/L (ref 39–117)
BUN: 8 mg/dL (ref 6–23)
CO2: 26 mEq/L (ref 19–32)
Chloride: 104 mEq/L (ref 96–112)
Creatinine, Ser: 0.64 mg/dL (ref 0.50–1.10)
GFR calc Af Amer: >90 mL/min (ref >90)
GFR calc non Af Amer: >90 mL/min (ref >90)
Glucose, Bld: 81 mg/dL (ref 70–99)
Potassium: 3.9 mEq/L (ref 3.5–5.1)
Total Bilirubin: 0.3 mg/dL (ref 0.3–1.2)

## 2012-10-23 LAB — URIC ACID: Uric Acid, Serum: 5.6 mg/dL (ref 2.4–7.0)

## 2012-10-23 LAB — US OB FOLLOW UP

## 2012-10-23 MED ORDER — OXYCODONE-ACETAMINOPHEN 5-325 MG PO TABS: 1.0000 | ORAL_TABLET | ORAL | Status: DC | PRN

## 2012-10-23 MED ORDER — SERTRALINE HCL 50 MG PO TABS: 50.0000 mg | ORAL_TABLET | Freq: Every day | ORAL | Status: AC

## 2012-10-23 MED ORDER — PNEUMOCOCCAL VAC POLYVALENT 25 MCG/0.5ML IJ INJ
0.5000 mL | INJECTION | Freq: Once | INTRAMUSCULAR | Status: AC
  Administered 2012-10-23: 0.5 mL via INTRAMUSCULAR
  Filled 2012-10-23: qty 0.5
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
## 2012-10-23 NOTE — Discharge Summary (Signed)
  Cesarean Section Delivery Discharge Summary  Megan Castaneda  DOB:    1985-10-06 MRN:    [id number] CSN:    [id number]  Date of admission:                  10/20/12  Date of discharge:                   10/23/12  Procedures this admission:  Repeat C/S scheduled / Maternal Request d/t PROM and macrosomia   Newborn Data:  Live born female  Birth Weight: 9 lb 11.2 oz (4400 g) APGAR: 7, 8  Home with mother.  History of Present Illness:  [name] is a 27 y.o. female, G2P1102, who presents at [redacted]w[redacted]d weeks gestation. The patient has been followed at the [facility] and Gynecology division of [facility].    Her pregnancy has been complicated by:  Patient Active Problem List  Diagnosis  . Obesity  . Anxiety  . Kidney stone  . Ear infection  . Multiple drug allergies--ibuprophen, amoxicillin, sulfa  . Gestational diabetes  . LGA (large for gestational age) infant  . PROM (premature rupture of membranes)   Pt is IDGDM on insulin   Hospital course:  The patient was admitted for PROM.   Her postpartum course was not complicated but CBGs were done 4xdaily. She was discharged to home on postpartum day 3 doing well. Her JP drain was removed at bedside before discharge.  Feeding:  Breast and bottle  Contraception:  Considering bilateral tubal ligation  Discharge hemoglobin:  Hemoglobin  Date Value Range Status  10/21/2012 11.6* 12.0 - 15.0 g/dL Final     HCT  Date Value Range Status  10/21/2012 33.9* 36.0 - 46.0 % Final   CBG (last 3)   Recent Labs  10/22/12 2155 10/23/12 0609 10/23/12 1004  GLUCAP 101* 70 95    Discharge Physical Exam:   General: alert, cooperative and no distress Lochia: appropriate Uterine Fundus: firm Incision: healing well, no significant drainage DVT Evaluation: No evidence of DVT seen on physical exam. Negative Homan's sign.  Intrapartum Procedures: cesarean: low cervical,  transverse Postpartum Procedures: CBGs Complications-Operative and Postpartum: none  Discharge Diagnoses: Term Pregnancy-delivered  Discharge Information:  Activity:           per [facility] handbook Diet:                routine Medications: Percocet and Zoloft per pt request Condition:      stable Instructions:  refer to practice specific booklet Discharge to: home  Follow-up Information   Follow up with [facility]. Schedule an appointment as soon as possible for a visit in 5 weeks. (Make your appt first thing in the morning and do not eat before you come in so we can test your blood sugar.    Call with any questions or concerns)    Contact information:   [address] [place] [address] [phone]       Haroldine Laws CNM, MSN 10/23/2012

## 2012-10-23 NOTE — Progress Notes (Signed)
[employer] RN contacted at 1200 on 10/23/12 due to MD's order for a follow-up in 1 week for BP check. Message left with [employer] RN due to being away from desk.

## 2012-11-05 ENCOUNTER — Inpatient Hospital Stay (HOSPITAL_COMMUNITY)
Admission: RE | Admit: 2012-11-05 | Payer: Medicaid Other | Source: Ambulatory Visit | Admitting: Obstetrics and Gynecology

## 2012-11-05 ENCOUNTER — Encounter (HOSPITAL_COMMUNITY): Admission: RE | Payer: Self-pay | Source: Ambulatory Visit

## 2012-11-05 SURGERY — Surgical Case
Anesthesia: Regional

## 2012-11-21 ENCOUNTER — Other Ambulatory Visit: Payer: Self-pay | Admitting: Obstetrics and Gynecology

## 2012-11-27 NOTE — MAU Note (Signed)
Chart audited for monthly QI 

## 2012-12-03 ENCOUNTER — Encounter (HOSPITAL_COMMUNITY): Payer: Self-pay | Admitting: Pharmacist

## 2012-12-13 ENCOUNTER — Inpatient Hospital Stay (HOSPITAL_COMMUNITY)
Admission: RE | Admit: 2012-12-13 | Discharge: 2012-12-13 | Disposition: A | Discharge status: Home / Self Care | Payer: Medicaid Other | Source: Ambulatory Visit

## 2012-12-13 ENCOUNTER — Other Ambulatory Visit (HOSPITAL_COMMUNITY): Payer: Self-pay | Admitting: Obstetrics and Gynecology

## 2012-12-13 NOTE — H&P (Signed)
Megan Castaneda is a 27 y.o.  female P 1-1-0-2  who presents for bilateral salpingectomy because of a desire for sterilization.  In the past the patient has used Depo Provera and oral contraceptives but has decided that she wants to end her childbearing potential.  Patient's menstrual flow lasts 4-7 days with a pad change every 4-6 hours with occasional cramping.  Cramps are readily relieved with Midol.  She denies inter-menstrual bleeding, vaginitis symptoms or urinary tract symptoms but she admits to dyspareunia (positional) and for the past week painful, hard stools with rectal bleeding seen with wiping.  Patient was advised that 50 % of females who choose sterilization for contraception before the age of 30 have regrets about their decision.  In spite of this the patient wishes to proceed with bilateral salpingectomy for sterilization.   Past Medical History  OB History: G2P1102 12/12/2009-Vaginal Delivery and 10/20/2012-C-section  GYN History: menarche: 27 YO    LMP: prior to delivery-is breast feeding    Contracepton no method  The patient reports a past history of: herpes.  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 Motrin (Ibuprofen) Other (See Comments)    Stomach pain  . Amoxicillin Rash  . Sulfa Antibiotics Rash    Denies sensitivity to peanuts, shellfish, soy, latex or adhesives.  ROS: Admits constipation, blood in stool, arthralgias/myalgias due to fibromyalgia (knees, ankle, hands & neck);  Denies headache, vision changes, nasal congestion, dysphagia, tinnitus, dizziness, hoarseness, cough,  chest pain, shortness of breath, nausea, vomiting, diarrhea, urinary frequency, urgency  dysuria, hematuria, vaginitis symptoms, pelvic pain,easy bruising, skin rashes, unexplained weight loss and except as is mentioned in the history of present illness, patient's review of systems is otherwise negative.  Physical Exam  Bp  96/70  R  16  T  98.3 degrees F orally  Weight 214lbs  Height 5'1"  Neck: supple without masses or thyromegaly Lungs: clear to auscultation Heart: regular rate and rhythm Abdomen: soft, non-tender and no organomegaly Pelvic:EGBUS- wnl; vagina-normal rugae; uterus-normal size, cervix without lesions or motion tenderness; adnexae-no tenderness or masses Extremities:  no clubbing, cyanosis or edema   Assesment:  Desire for Sterilization   Disposition:  A discussion was held with patient regarding the indication for her procedure(s) along with the risks, which include but are not limited to: reaction to anesthesia, damage to adjacent organs, infection, excessive bleeding and a failure rate of 2 per 500.  The patient verbalized understanding of these risks and has consented to proceed with Bilateral Salpingectomy for Sterilization at [facility] Dec 18, 2012 at 9:30 a.m.  CSN# [id number]   Kyree Adriano J. Jewel Venditto, PA-C  for Dr. Naima A. Dillard   

## 2012-12-14 ENCOUNTER — Encounter (HOSPITAL_COMMUNITY)
Admission: RE | Admit: 2012-12-14 | Discharge: 2012-12-14 | Disposition: A | Discharge status: Home / Self Care | Payer: Medicaid Other | Source: Ambulatory Visit | Attending: Obstetrics and Gynecology | Admitting: Obstetrics and Gynecology

## 2012-12-14 ENCOUNTER — Encounter (HOSPITAL_COMMUNITY): Payer: Self-pay

## 2012-12-14 HISTORY — DX: Depression, unspecified: F32.A

## 2012-12-14 HISTORY — DX: Major depressive disorder, single episode, unspecified: F32.9

## 2012-12-14 LAB — CBC
MCH: 30.7 pg (ref 26.0–34.0)
Platelets: 180 10*3/uL (ref 150–400)
RBC: 4.33 MIL/uL (ref 3.87–5.11)
RDW: 13.5 % (ref 11.5–15.5)
WBC: 7.2 10*3/uL (ref 4.0–10.5)

## 2012-12-14 LAB — SURGICAL PCR SCREEN: MRSA, PCR: NEGATIVE

## 2012-12-14 LAB — BASIC METABOLIC PANEL
BUN: 16 mg/dL (ref 6–23)
Chloride: 102 mEq/L (ref 96–112)
Creatinine, Ser: 0.93 mg/dL (ref 0.50–1.10)
GFR calc Af Amer: >90 mL/min (ref >90)
Glucose, Bld: 96 mg/dL (ref 70–99)

## 2012-12-14 LAB — TYPE AND SCREEN: ABO/RH(D): A POS

## 2012-12-14 NOTE — Patient Instructions (Addendum)
   Your procedure is scheduled on: Tuesday May 27th Enter through the Main Entrance of [facility] at: 8am  Pick up the phone at the desk and dial [phone] and inform us of your arrival.  Please call this number if you have any problems the morning of surgery: [phone]  Remember: Do not eat any solid foods or drink any liquids after midnight XB:JYNWGN, May 26th Please take these medications morning of surgery:  Do not wear jewelry, make-up, or FINGER nail polish No metal in your hair or on your body. Do not wear lotions, powders, perfumes. You may wear deodorant.  Please use your CHG wash as directed prior to surgery.  Do not shave anywhere for at least 12 hours prior to first CHG shower.  Do not bring valuables to the hospital. Contacts, Dentures and Partial Plates may not be worn to OR    For patients being discharged-you must have a ride home.

## 2012-12-18 ENCOUNTER — Ambulatory Visit (HOSPITAL_COMMUNITY): Payer: Medicaid Other | Admitting: Anesthesiology

## 2012-12-18 ENCOUNTER — Encounter (HOSPITAL_COMMUNITY)
Admission: RE | Disposition: A | Discharge status: Home / Self Care | Payer: Self-pay | Source: Ambulatory Visit | Attending: Obstetrics and Gynecology

## 2012-12-18 ENCOUNTER — Ambulatory Visit (HOSPITAL_COMMUNITY)
Admission: RE | Admit: 2012-12-18 | Discharge: 2012-12-18 | Disposition: A | Discharge status: Home / Self Care | Payer: Medicaid Other | Source: Ambulatory Visit | Attending: Obstetrics and Gynecology | Admitting: Obstetrics and Gynecology

## 2012-12-18 ENCOUNTER — Encounter (HOSPITAL_COMMUNITY): Payer: Self-pay | Admitting: *Deleted

## 2012-12-18 ENCOUNTER — Encounter (HOSPITAL_COMMUNITY): Payer: Self-pay | Admitting: Anesthesiology

## 2012-12-18 DIAGNOSIS — Y9210 Unspecified residential institution as the place of occurrence of the external cause: Secondary | ICD-10-CM | POA: Insufficient documentation

## 2012-12-18 DIAGNOSIS — Z302 Encounter for sterilization: Secondary | ICD-10-CM | POA: Diagnosis not present

## 2012-12-18 DIAGNOSIS — Z641 Problems related to multiparity: Secondary | ICD-10-CM | POA: Diagnosis not present

## 2012-12-18 DIAGNOSIS — IMO0002 Reserved for concepts with insufficient information to code with codable children: Secondary | ICD-10-CM | POA: Insufficient documentation

## 2012-12-18 DIAGNOSIS — Z9889 Other specified postprocedural states: Secondary | ICD-10-CM

## 2012-12-18 HISTORY — PX: LAPAROSCOPY: SHX197

## 2012-12-18 HISTORY — PX: BILATERAL SALPINGECTOMY: SHX5743

## 2012-12-18 SURGERY — LAPAROSCOPY OPERATIVE
Anesthesia: General | Site: Abdomen | Wound class: Clean Contaminated

## 2012-12-18 MED ORDER — MEPERIDINE HCL 25 MG/ML IJ SOLN
INTRAMUSCULAR | Status: AC
  Filled 2012-12-18: qty 1

## 2012-12-18 MED ORDER — OXYCODONE-ACETAMINOPHEN 5-325 MG PO TABS
ORAL_TABLET | ORAL | Status: AC
  Filled 2012-12-18: qty 2

## 2012-12-18 MED ORDER — ONDANSETRON HCL 4 MG/2ML IJ SOLN
INTRAMUSCULAR | Status: AC
  Filled 2012-12-18: qty 2

## 2012-12-18 MED ORDER — SUCCINYLCHOLINE CHLORIDE 20 MG/ML IJ SOLN
INTRAMUSCULAR | Status: DC | PRN
  Administered 2012-12-18: 120 mg via INTRAVENOUS

## 2012-12-18 MED ORDER — FENTANYL CITRATE 0.05 MG/ML IJ SOLN: 25.0000 ug | INTRAMUSCULAR | Status: DC | PRN

## 2012-12-18 MED ORDER — ROCURONIUM BROMIDE 100 MG/10ML IV SOLN
INTRAVENOUS | Status: DC | PRN
  Administered 2012-12-18: 5 mg via INTRAVENOUS

## 2012-12-18 MED ORDER — LIDOCAINE HCL (CARDIAC) 20 MG/ML IV SOLN
INTRAVENOUS | Status: AC
  Filled 2012-12-18: qty 5

## 2012-12-18 MED ORDER — OXYCODONE-ACETAMINOPHEN 5-325 MG PO TABS
1.0000 | ORAL_TABLET | ORAL | Status: DC | PRN
  Administered 2012-12-18: 1 via ORAL

## 2012-12-18 MED ORDER — MIDAZOLAM HCL 5 MG/5ML IJ SOLN
INTRAMUSCULAR | Status: DC | PRN
  Administered 2012-12-18: 2 mg via INTRAVENOUS

## 2012-12-18 MED ORDER — FENTANYL CITRATE 0.05 MG/ML IJ SOLN
INTRAMUSCULAR | Status: AC
  Filled 2012-12-18: qty 5

## 2012-12-18 MED ORDER — KETOROLAC TROMETHAMINE 30 MG/ML IJ SOLN
INTRAMUSCULAR | Status: DC | PRN
  Administered 2012-12-18: 30 mg via INTRAVENOUS

## 2012-12-18 MED ORDER — FENTANYL CITRATE 0.05 MG/ML IJ SOLN
INTRAMUSCULAR | Status: AC
  Filled 2012-12-18: qty 2

## 2012-12-18 MED ORDER — GLYCOPYRROLATE 0.2 MG/ML IJ SOLN
INTRAMUSCULAR | Status: DC | PRN
  Administered 2012-12-18 (×2): 0.1 mg via INTRAVENOUS

## 2012-12-18 MED ORDER — DEXAMETHASONE SODIUM PHOSPHATE 10 MG/ML IJ SOLN
INTRAMUSCULAR | Status: AC
  Filled 2012-12-18: qty 1

## 2012-12-18 MED ORDER — ONDANSETRON HCL 4 MG/2ML IJ SOLN
INTRAMUSCULAR | Status: DC | PRN
  Administered 2012-12-18: 4 mg via INTRAVENOUS

## 2012-12-18 MED ORDER — SILVER NITRATE-POT NITRATE 75-25 % EX MISC
CUTANEOUS | Status: DC | PRN
  Administered 2012-12-18: 5

## 2012-12-18 MED ORDER — FENTANYL CITRATE 0.05 MG/ML IJ SOLN
INTRAMUSCULAR | Status: DC | PRN
  Administered 2012-12-18 (×2): 100 ug via INTRAVENOUS
  Administered 2012-12-18 (×3): 50 ug via INTRAVENOUS

## 2012-12-18 MED ORDER — GLYCOPYRROLATE 0.2 MG/ML IJ SOLN
INTRAMUSCULAR | Status: AC
  Filled 2012-12-18: qty 4

## 2012-12-18 MED ORDER — BUPIVACAINE-EPINEPHRINE PF 0.25-1:200000 % IJ SOLN
INTRAMUSCULAR | Status: AC
  Filled 2012-12-18: qty 30

## 2012-12-18 MED ORDER — PROPOFOL 10 MG/ML IV EMUL
INTRAVENOUS | Status: AC
  Filled 2012-12-18: qty 40

## 2012-12-18 MED ORDER — MEPERIDINE HCL 25 MG/ML IJ SOLN
6.2500 mg | INTRAMUSCULAR | Status: DC | PRN
  Administered 2012-12-18: 12.5 mg via INTRAVENOUS

## 2012-12-18 MED ORDER — LACTATED RINGERS IV SOLN
INTRAVENOUS | Status: DC
  Administered 2012-12-18: 50 mL/h via INTRAVENOUS
  Administered 2012-12-18: 10:00:00 via INTRAVENOUS

## 2012-12-18 MED ORDER — MIDAZOLAM HCL 2 MG/2ML IJ SOLN
INTRAMUSCULAR | Status: AC
  Filled 2012-12-18: qty 2

## 2012-12-18 MED ORDER — ROCURONIUM BROMIDE 50 MG/5ML IV SOLN
INTRAVENOUS | Status: AC
  Filled 2012-12-18: qty 1

## 2012-12-18 MED ORDER — LIDOCAINE HCL (CARDIAC) 20 MG/ML IV SOLN
INTRAVENOUS | Status: DC | PRN
  Administered 2012-12-18: 80 mg via INTRAVENOUS

## 2012-12-18 MED ORDER — DEXAMETHASONE SODIUM PHOSPHATE 10 MG/ML IJ SOLN
INTRAMUSCULAR | Status: DC | PRN
  Administered 2012-12-18: 10 mg via INTRAVENOUS

## 2012-12-18 MED ORDER — KETOROLAC TROMETHAMINE 30 MG/ML IJ SOLN
INTRAMUSCULAR | Status: AC
  Filled 2012-12-18: qty 1

## 2012-12-18 MED ORDER — BUPIVACAINE-EPINEPHRINE 0.25% -1:200000 IJ SOLN
INTRAMUSCULAR | Status: DC | PRN
  Administered 2012-12-18: 8 mL

## 2012-12-18 MED ORDER — PROPOFOL 10 MG/ML IV BOLUS
INTRAVENOUS | Status: DC | PRN
  Administered 2012-12-18: 200 mg via INTRAVENOUS
  Administered 2012-12-18 (×2): 30 mg via INTRAVENOUS
  Administered 2012-12-18: 40 mg via INTRAVENOUS

## 2012-12-18 MED ORDER — OXYCODONE-ACETAMINOPHEN 5-325 MG PO TABS: 1.0000 | ORAL_TABLET | ORAL | Status: AC | PRN

## 2012-12-18 MED ORDER — PROPOFOL 10 MG/ML IV EMUL
INTRAVENOUS | Status: AC
  Filled 2012-12-18: qty 20

## 2012-12-18 MED ORDER — NEOSTIGMINE METHYLSULFATE 1 MG/ML IJ SOLN
INTRAMUSCULAR | Status: AC
  Filled 2012-12-18: qty 1

## 2012-12-18 SURGICAL SUPPLY — 32 items
BARRIER ADHS 3X4 INTERCEED (GAUZE/BANDAGES/DRESSINGS) IMPLANT
CABLE HIGH FREQUENCY MONO STRZ (ELECTRODE) IMPLANT
CHLORAPREP W/TINT 26ML (MISCELLANEOUS) ×3 IMPLANT
CLOTH BEACON ORANGE TIMEOUT ST (SAFETY) ×3 IMPLANT
DERMABOND ADVANCED (GAUZE/BANDAGES/DRESSINGS) ×1
DERMABOND ADVANCED .7 DNX12 (GAUZE/BANDAGES/DRESSINGS) ×2 IMPLANT
DRSG VASELINE 3X18 (GAUZE/BANDAGES/DRESSINGS) IMPLANT
FILTER SMOKE EVAC LAPAROSHD (FILTER) ×3 IMPLANT
FORCEPS CUTTING 33CM 5MM (CUTTING FORCEPS) ×3 IMPLANT
FORCEPS CUTTING 45CM 5MM (CUTTING FORCEPS) IMPLANT
GLOVE BIO SURGEON STRL SZ 6.5 (GLOVE) ×3 IMPLANT
GLOVE BIOGEL PI IND STRL 7.0 (GLOVE) ×2 IMPLANT
GLOVE BIOGEL PI INDICATOR 7.0 (GLOVE) ×1
GOWN PREVENTION PLUS LG XLONG (DISPOSABLE) ×9 IMPLANT
MANIPULATOR UTERINE 4.5 ZUMI (MISCELLANEOUS) IMPLANT
NS IRRIG 1000ML POUR BTL (IV SOLUTION) ×3 IMPLANT
PACK LAPAROSCOPY BASIN (CUSTOM PROCEDURE TRAY) ×3 IMPLANT
POUCH SPECIMEN RETRIEVAL 10MM (ENDOMECHANICALS) IMPLANT
PROTECTOR NERVE ULNAR (MISCELLANEOUS) ×3 IMPLANT
SCALPEL HARMONIC ACE (MISCELLANEOUS) ×3 IMPLANT
SET IRRIG TUBING LAPAROSCOPIC (IRRIGATION / IRRIGATOR) IMPLANT
SOLUTION ELECTROLUBE (MISCELLANEOUS) IMPLANT
SPONGE LAP 4X18 X RAY DECT (DISPOSABLE) ×3 IMPLANT
SUT MNCRL AB 3-0 PS2 27 (SUTURE) ×3 IMPLANT
SUT VICRYL 0 ENDOLOOP (SUTURE) IMPLANT
SUT VICRYL 0 UR6 27IN ABS (SUTURE) ×12 IMPLANT
TOWEL OR 17X24 6PK STRL BLUE (TOWEL DISPOSABLE) ×6 IMPLANT
TRAY FOLEY CATH 14FR (SET/KITS/TRAYS/PACK) ×3 IMPLANT
TROCAR BALLN 12MMX100 BLUNT (TROCAR) ×3 IMPLANT
TROCAR XCEL NON-BLD 5MMX100MML (ENDOMECHANICALS) ×6 IMPLANT
WARMER LAPAROSCOPE (MISCELLANEOUS) ×3 IMPLANT
WATER STERILE IRR 1000ML POUR (IV SOLUTION) ×3 IMPLANT

## 2012-12-18 NOTE — Transfer of Care (Signed)
Immediate Anesthesia Transfer of Care Note  Patient: Megan Castaneda  Procedure(s) Performed: Procedure(s) with comments: LAPAROSCOPY OPERATIVE (N/A) - see OR note BILATERAL SALPINGECTOMY (Bilateral)  Patient Location: PACU  Anesthesia Type:General  Level of Consciousness: awake and oriented  Airway & Oxygen Therapy: Patient Spontanous Breathing and Patient connected to nasal cannula oxygen  Post-op Assessment: Report given to PACU RN and Post -op Vital signs reviewed and stable  Post vital signs: Reviewed and stable  Complications: No apparent anesthesia complications

## 2012-12-18 NOTE — H&P (View-Only) (Signed)
Megan Castaneda is a 27 y.o.  female P 1-1-0-2  who presents for bilateral salpingectomy because of a desire for sterilization.  In the past the patient has used Depo Provera and oral contraceptives but has decided that she wants to end her childbearing potential.  Patient's menstrual flow lasts 4-7 days with a pad change every 4-6 hours with occasional cramping.  Cramps are readily relieved with Midol.  She denies inter-menstrual bleeding, vaginitis symptoms or urinary tract symptoms but she admits to dyspareunia (positional) and for the past week painful, hard stools with rectal bleeding seen with wiping.  Patient was advised that 50 % of females who choose sterilization for contraception before the age of 62 have regrets about their decision.  In spite of this the patient wishes to proceed with bilateral salpingectomy for sterilization.   Past Medical History  OB History: [id number] 12/12/2009-Vaginal Delivery and 10/20/2012-C-section  GYN History: menarche: 27 YO    LMP: prior to delivery-is breast feeding    Contracepton no method  The patient reports a past history of: herpes.  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 Motrin (Ibuprofen) Other (See Comments)    Stomach pain  . Amoxicillin Rash  . Sulfa Antibiotics Rash    Denies sensitivity to peanuts, shellfish, soy, latex or adhesives.  ROS: Admits constipation, blood in stool, arthralgias/myalgias due to fibromyalgia (knees, ankle, hands & neck);  Denies headache, vision changes, nasal congestion, dysphagia, tinnitus, dizziness, hoarseness, cough,  chest pain, shortness of breath, nausea, vomiting, diarrhea, urinary frequency, urgency  dysuria, hematuria, vaginitis symptoms, pelvic pain,easy bruising, skin rashes, unexplained weight loss and except as is mentioned in the history of present illness, patient's review of systems is otherwise negative.  Physical Exam  Bp  96/70  R  16  T  98.3 degrees F orally  Weight 214lbs  Height 5'1"  Neck: supple without masses or thyromegaly Lungs: clear to auscultation Heart: regular rate and rhythm Abdomen: soft, non-tender and no organomegaly Pelvic:EGBUS- wnl; vagina-normal rugae; uterus-normal size, cervix without lesions or motion tenderness; adnexae-no tenderness or masses Extremities:  no clubbing, cyanosis or edema   Assesment:  Desire for Sterilization   Disposition:  A discussion was held with patient regarding the indication for her procedure(s) along with the risks, which include but are not limited to: reaction to anesthesia, damage to adjacent organs, infection, excessive bleeding and a failure rate of 2 per 500.  The patient verbalized understanding of these risks and has consented to proceed with Bilateral Salpingectomy for Sterilization at [facility] Dec 18, 2012 at 9:30 a.m.  CSN# [id number]   Adylee Leonardo J. Lowell Guitar, PA-C  for Dr. Pierre Bali. Dillard

## 2012-12-18 NOTE — Anesthesia Preprocedure Evaluation (Addendum)
Anesthesia Evaluation  Patient identified by MRN, date of birth, ID band Patient awake    Reviewed: Allergy & Precautions, H&P , Patient's Chart, lab work & pertinent test results, reviewed documented beta blocker date and time   Airway Mallampati: II TM Distance: >3 FB Neck ROM: full    Dental no notable dental hx.    Pulmonary  breath sounds clear to auscultation  Pulmonary exam normal       Cardiovascular Rhythm:regular Rate:Normal     Neuro/Psych    GI/Hepatic   Endo/Other    Renal/GU      Musculoskeletal   Abdominal   Peds  Hematology   Anesthesia Other Findings   Reproductive/Obstetrics                           Anesthesia Physical Anesthesia Plan  ASA: III  Anesthesia Plan: General   Post-op Pain Management:    Induction: Intravenous  Airway Management Planned: Oral ETT and Video Laryngoscope Planned  Additional Equipment:   Intra-op Plan:   Post-operative Plan:   Informed Consent: I have reviewed the patients History and Physical, chart, labs and discussed the procedure including the risks, benefits and alternatives for the proposed anesthesia with the patient or authorized representative who has indicated his/her understanding and acceptance.   Dental Advisory Given and Dental advisory given  Plan Discussed with: CRNA and Surgeon  Anesthesia Plan Comments: (  Discussed  general anesthesia with DL or video, including possible nausea, instrumentation of airway, sore throat,pulmonary aspiration, etc. I asked if the were any outstanding questions, or  concerns before we proceeded. )       Anesthesia Quick Evaluation

## 2012-12-18 NOTE — Interval H&P Note (Signed)
History and Physical Interval Note:  12/18/2012 9:29 AM  Megan Castaneda  has presented today for surgery, with the diagnosis of Desires Permanent Sterilzation  The various methods of treatment have been discussed with the patient and family. After consideration of risks, benefits and other options for treatment, the patient has consented to  Procedure(s): LAPAROSCOPY OPERATIVE (N/A) BILATERAL SALPINGECTOMY (Bilateral) as a surgical intervention .  The patient's history has been reviewed, patient examined, no change in status, stable for surgery.  I have reviewed the patient's chart and labs.  Questions were answered to the patient's satisfaction.     [facility]

## 2012-12-18 NOTE — OR Nursing (Signed)
Vaginal laceration repair performed by Dr. Normand Sloop under general anesthesia.

## 2012-12-18 NOTE — Anesthesia Postprocedure Evaluation (Signed)
  Anesthesia Post-op Note  Patient: Megan Castaneda  Procedure(s) Performed: Procedure(s) with comments: LAPAROSCOPY OPERATIVE (N/A) - see OR note BILATERAL SALPINGECTOMY (Bilateral)  Patient is awake and responsive. Pain and nausea are reasonably well controlled. Vital signs are stable and clinically acceptable. Oxygen saturation is clinically acceptable. There are no apparent anesthetic complications at this time. Patient is ready for discharge.

## 2012-12-18 NOTE — Op Note (Signed)
  Indications: [name] is a 27 y.o. female with diagnosis of multiparity.  Pre-operative Diagnosis: Multiparity desires sterilization  Post-operative Diagnosis: same  Surgeon: ZOXWRUE,AVWUJ A   Assistants: none  Anesthesia: General endotracheal anesthesia  Procedure:  Bilateral salpingectomies and repair of vaginal and cervical lacerations  Procedure Details  The patient was seen in the Holding Room. The risks, benefits, complications, treatment options, and expected outcomes were discussed with the patient. The possibilities of reaction to medication, pulmonary aspiration, perforation of viscus, bleeding, recurrent infection, the need for additional procedures, failure to diagnose a condition, and creating a complication requiring transfusion or operation were discussed with the patient. The patient concurred with the proposed plan, giving informed consent. The patient was taken to the Operating Room, identified as Megan Castaneda and the procedure verified as Diagnostic Laparoscopy with B salpingectomies. A Time Out was held and the above information confirmed.  After induction of general anesthesia, the patient was placed in modified dorsal lithotomy position where she was prepped, draped, and catheterized in the normal, sterile fashion. A foley catheter was placed..  The cervix was visualized and an intrauterine manipulator was placed. A 2 cm umbilical incision was then performed.and carried down to the fascia.  The fascia was then opened and extended bilaterally.  Peritoneum was then entered.  o vicryl was then placed around the fascia in a circumferential fashion.   The hasson was placed and ancored to the suture.  Two 5mm incisions were made in the lower right and left quadrants.  Two 5mm trocars were placed under direct visualization.   Normal pelvic anatomy was noted.   Uterus,tubes, ovaries apperared normal.   The anterior and  Posterior culdesac and liver appeared normal. There  was an omental adhesion to the anterior abdominal wall.  This was lysed with the gyrus.    Both fallopian tubes were identified and  Removed using bipolar cautery.  Hemostasis was assured  Following the procedure the umbilical hasson was removed after intra-abdominal carbon dioxide was expressed. The fascia was reaproximated by tying the 0 vicryl suture.   The 5mm skin incisions were closed with dermabond.  The 10 mm incision was closed with a  subcuticular suture of 3-0 monocryl. The intrauterine manipulator was then removed.  The tenaculum site was bleeding.  The tenaculum fell off of the cervix during the procedure.  This was made hemostatic with silver nitrate and figure of 8 3-0 vicryl stitch.  The right side of the vaginal wall had a laceration from the speculu.  This was also bleeding.  This was made hemostatic with 3-0 vicryl.   Instrument, sponge, and needle counts were correct prior to abdominal closure and at the conclusion of the case.  Findings: See above Estimated Blood Loss:  200 cc         Drains: none         Total IV Fluids:Intravenous fluids were administered, normal saline 1700 ml's.         Specimens: none              Complications: vaginal and cervical laceration.         Disposition: PACU - hemodynamically stable.         Condition: stable

## 2012-12-19 ENCOUNTER — Encounter (HOSPITAL_COMMUNITY): Payer: Self-pay | Admitting: Obstetrics and Gynecology

## 2013-03-22 ENCOUNTER — Emergency Department (HOSPITAL_COMMUNITY)
Admission: EM | Admit: 2013-03-22 | Discharge: 2013-03-22 | Disposition: A | Discharge status: Home / Self Care | Payer: Self-pay | Attending: Emergency Medicine | Admitting: Emergency Medicine

## 2013-03-22 ENCOUNTER — Encounter (HOSPITAL_COMMUNITY): Payer: Self-pay | Admitting: Emergency Medicine

## 2013-03-22 DIAGNOSIS — Z880 Allergy status to penicillin: Secondary | ICD-10-CM | POA: Insufficient documentation

## 2013-03-22 DIAGNOSIS — T148XXA Other injury of unspecified body region, initial encounter: Secondary | ICD-10-CM

## 2013-03-22 DIAGNOSIS — X503XXA Overexertion from repetitive movements, initial encounter: Secondary | ICD-10-CM | POA: Insufficient documentation

## 2013-03-22 DIAGNOSIS — Z79899 Other long term (current) drug therapy: Secondary | ICD-10-CM | POA: Insufficient documentation

## 2013-03-22 DIAGNOSIS — F439 Reaction to severe stress, unspecified: Secondary | ICD-10-CM

## 2013-03-22 DIAGNOSIS — F430 Acute stress reaction: Secondary | ICD-10-CM | POA: Insufficient documentation

## 2013-03-22 DIAGNOSIS — M542 Cervicalgia: Secondary | ICD-10-CM | POA: Insufficient documentation

## 2013-03-22 DIAGNOSIS — N189 Chronic kidney disease, unspecified: Secondary | ICD-10-CM | POA: Insufficient documentation

## 2013-03-22 DIAGNOSIS — IMO0001 Reserved for inherently not codable concepts without codable children: Secondary | ICD-10-CM | POA: Insufficient documentation

## 2013-03-22 DIAGNOSIS — Z8619 Personal history of other infectious and parasitic diseases: Secondary | ICD-10-CM | POA: Insufficient documentation

## 2013-03-22 DIAGNOSIS — Z87891 Personal history of nicotine dependence: Secondary | ICD-10-CM | POA: Insufficient documentation

## 2013-03-22 DIAGNOSIS — J45909 Unspecified asthma, uncomplicated: Secondary | ICD-10-CM | POA: Insufficient documentation

## 2013-03-22 DIAGNOSIS — Y939 Activity, unspecified: Secondary | ICD-10-CM | POA: Insufficient documentation

## 2013-03-22 DIAGNOSIS — E119 Type 2 diabetes mellitus without complications: Secondary | ICD-10-CM | POA: Insufficient documentation

## 2013-03-22 DIAGNOSIS — Y929 Unspecified place or not applicable: Secondary | ICD-10-CM | POA: Insufficient documentation

## 2013-03-22 DIAGNOSIS — Z87440 Personal history of urinary (tract) infections: Secondary | ICD-10-CM | POA: Insufficient documentation

## 2013-03-22 DIAGNOSIS — S43499A Other sprain of unspecified shoulder joint, initial encounter: Secondary | ICD-10-CM | POA: Insufficient documentation

## 2013-03-22 DIAGNOSIS — Z8742 Personal history of other diseases of the female genital tract: Secondary | ICD-10-CM | POA: Insufficient documentation

## 2013-03-22 DIAGNOSIS — F341 Dysthymic disorder: Secondary | ICD-10-CM | POA: Insufficient documentation

## 2013-03-22 MED ORDER — DIAZEPAM 5 MG PO TABS: 5.0000 mg | ORAL_TABLET | Freq: Two times a day (BID) | ORAL | Status: AC

## 2013-03-22 NOTE — ED Provider Notes (Signed)
Medical screening examination/treatment/procedure(s) were performed by non-physician practitioner and as supervising physician I was immediately available for consultation/collaboration.   William Alicia Seib, MD 03/22/13 2050 

## 2013-03-22 NOTE — ED Notes (Signed)
Pt w/ upper back and shoulder pain x2 weeks, denies any known mechanism of injury. Pt A&Ox4, in no acute distress, states pain noted after delivering her child in March. Pt is A&Ox4 in no acute distress.

## 2013-03-22 NOTE — ED Provider Notes (Signed)
CSN: [id number]     Arrival date & time 03/22/13  1932 History  This chart was scribed for non-physician practitioner working with Dagmar Hait, MD by Ronal Fear, ED scribe and Greggory Stallion, ED scribe. This patient was seen in room WTR7/WTR7 and the patient's care was started at 7:42 PM.   Chief Complaint  Patient presents with  . Shoulder Pain  . Back Pain   The history is provided by the patient. No language interpreter was used.    HPI Comments: Megan Castaneda is a 27 y.o. female who presents to the Emergency Department complaining of sharp, constant gradually worsening neck pain that radiates to bilateral shoulders rated 10/10 that started a few weeks ago. She denies injury or trauma. Pt had a baby in March so she has been lifting her arms often. Sitting and certain movements make the pain worse. Tylenol and flexeril provide no relief. Pt denies back pain, numbness and tingling. Admits to being under increased stress and has been asking her husband to get her a massage to release tension.   Past Medical History  Diagnosis Date  . Complication of anesthesia 2011    Has ongoing shoulder and back pain from epidural  . Infection     Yeast;not frequent  . Infection     UTI;not frequent  . Chlamydia 2006    was treated  . Gonorrhea 2006    was treated  . PID (acute pelvic inflammatory disease) 2006    Antibxs;found out after + GC/CT  . Urinary tract infection   . Anxiety     Zoloft for anxiety and PPD  . Depression     PPD-improved w/Zoloft  . Asthma     triggered by dust;colds;has inhaler prn but doesn't use/not needed  . Diabetes mellitus without complication     Pt. was diagnosed with Gestational Diabetes with recent pregnancy  . Chronic kidney disease     kidney stones-last incident was October  . Fibromyalgia     not meds for this at this time-tylenol prn   Past Surgical History  Procedure Laterality Date  . Appendectomy  05/2008  . Wisdom tooth extraction   1999    All 4 removed  . Cesarean section N/A 10/20/2012    Procedure: Primary CESAREAN SECTION  of baby boy  at 2336 APGAR 7/9;  Surgeon: Michael Litter, MD;  Location: [facility];  Service: Obstetrics;  Laterality: N/A;  . Laparoscopy N/A 12/18/2012    Procedure: LAPAROSCOPY OPERATIVE;  Surgeon: Michael Litter, MD;  Location: [facility];  Service: Gynecology;  Laterality: N/A;  see OR note  . Bilateral salpingectomy Bilateral 12/18/2012    Procedure: BILATERAL SALPINGECTOMY;  Surgeon: Michael Litter, MD;  Location: [facility];  Service: Gynecology;  Laterality: Bilateral;   Family History  Problem Relation Age of Onset  . Thyroid disease Mother   . Cancer Mother     Gallbladder  . Depression Mother   . Anxiety disorder Mother   . Fibromyalgia Mother   . Diabetes Maternal Grandmother   . Bipolar disorder Maternal Grandmother   . Depression Maternal Grandmother   . Anxiety disorder Maternal Grandmother   . Fibromyalgia Maternal Grandmother   . Diabetes Maternal Grandfather   . Dementia Paternal Grandmother   . Dementia Paternal Grandfather   . Other Neg Hx    History  Substance Use Topics  . Smoking status: Former Smoker    Types: Cigarettes    Quit date: 07/25/2010  .  Smokeless tobacco: Never Used  . Alcohol Use: No   OB History   Grav Para Term Preterm Abortions TAB SAB Ect Mult Living   2 2 1 1      2      Review of Systems  HENT: Positive for neck pain.   Musculoskeletal: Positive for arthralgias. Negative for back pain.  Neurological: Negative for numbness.  All other systems reviewed and are negative.    Allergies  Motrin; Amoxicillin; and Sulfa antibiotics  Home Medications   Current Outpatient Rx  Name  Route  Sig  Dispense  Refill  . acetaminophen (TYLENOL) 325 MG tablet   Oral   Take 650 mg by mouth every 6 (six) hours as needed for pain.         . cyclobenzaprine (FLEXERIL) 10 MG tablet   Oral   Take 10 mg by mouth 2 (two) times daily as needed for muscle  spasms.         Marland Kitchen docusate sodium (COLACE) 100 MG capsule   Oral   Take 200 mg by mouth daily. For bm         . oxyCODONE-acetaminophen (PERCOCET/ROXICET) 5-325 MG per tablet   Oral   Take 1 tablet by mouth as needed.   30 tablet   0   . Prenatal Vit-Fe Fumarate-FA (PRENATAL MULTIVITAMIN) TABS   Oral   Take 1 tablet by mouth daily at 12 noon.         . sertraline (ZOLOFT) 50 MG tablet   Oral   Take 1 tablet (50 mg total) by mouth daily.   30 tablet   2    BP 124/84  Pulse 89  Temp(Src) 98.1 F (36.7 C) (Oral)  Resp 20  SpO2 100%  Physical Exam  Nursing note and vitals reviewed. Constitutional: She is oriented to person, place, and time. She appears well-developed and well-nourished. No distress.  HENT:  Head: Normocephalic and atraumatic.  Mouth/Throat: Oropharynx is clear and moist.  Eyes: Conjunctivae and EOM are normal.  Neck: Normal range of motion.  Cardiovascular: Normal rate, regular rhythm and normal heart sounds.   Pulmonary/Chest: Effort normal and breath sounds normal. No respiratory distress.  Musculoskeletal: Normal range of motion. She exhibits no edema.  Tenderness and tension upon palpation to trapezius muscles bilateral. Strength 5/5 equal bilaterally. No bony tenderness of C-spine.   Neurological: She is alert and oriented to person, place, and time. No sensory deficit.  Sensation intact.   Skin: Skin is warm and dry.  Psychiatric: She has a normal mood and affect. Her behavior is normal.    ED Course  Procedures (including critical care time) DIAGNOSTIC STUDIES: Oxygen Saturation is 100% on RA, normal by my interpretation.    COORDINATION OF CARE: 7:46 PM- Pt advised of plan for treatment including valium, rest, and massage and pt agrees.   Labs Review Labs Reviewed - No data to display Imaging Review No results found.  MDM   1. Muscle strain   2. Stress    Patient with bilateral trapezius pain. Admits to being under  increased stress. Full ROM. Neurovascularly intact. No spinous process tenderness. Advised rest, heating pad. Valium for muscle spasm. Discussed stress management. Return precautions discussed. Patient states understanding of plan and is agreeable.   I personally performed the services described in this documentation, which was scribed in my presence. The recorded information has been reviewed and is accurate.    Trevor Mace, PA-C 03/22/13 2000

## 2013-03-22 NOTE — ED Notes (Signed)
Pt ambulating independently w/ steady gait on d/c in no acute distress, A&Ox4. D/c instructions reviewed w/ pt and family - pt and family deny any further questions or concerns at present. Rx given x1, Pt instructed to not use alcohol, drive, or operate heavy machinery while take the prescription pain medications as they could make him drowsy - pt verbalized understanding.    

## 2013-03-22 NOTE — ED Notes (Addendum)
Pt presents today with a chief complaint of back and shoulder pain that has been going on for two weeks. Pt states that the pain starts in her back and travels to the shoulders. Pt denies any injury or trauma, however states this began after she delivered her children. Pt states that it has been worse since the last delivery on March 29th. Pt is A/O x4 and in NAD.

## 2014-05-26 ENCOUNTER — Encounter (HOSPITAL_COMMUNITY): Payer: Self-pay | Admitting: Emergency Medicine

## 2014-07-15 ENCOUNTER — Emergency Department (HOSPITAL_COMMUNITY)
Admission: EM | Admit: 2014-07-15 | Discharge: 2014-07-15 | Discharge status: Absent without leave | Payer: Medicaid Other

## 2020-09-25 ENCOUNTER — Encounter (HOSPITAL_COMMUNITY): Payer: Self-pay

## 2020-09-25 ENCOUNTER — Emergency Department (HOSPITAL_COMMUNITY)
Admission: EM | Admit: 2020-09-25 | Discharge: 2020-09-26 | Disposition: A | Discharge status: Home / Self Care | Payer: Medicaid - Out of State | Attending: Emergency Medicine | Admitting: Emergency Medicine

## 2020-09-25 ENCOUNTER — Emergency Department (HOSPITAL_COMMUNITY): Payer: Medicaid - Out of State

## 2020-09-25 ENCOUNTER — Other Ambulatory Visit: Payer: Self-pay

## 2020-09-25 DIAGNOSIS — K30 Functional dyspepsia: Secondary | ICD-10-CM | POA: Insufficient documentation

## 2020-09-25 DIAGNOSIS — K209 Esophagitis, unspecified without bleeding: Secondary | ICD-10-CM | POA: Diagnosis not present

## 2020-09-25 DIAGNOSIS — E1122 Type 2 diabetes mellitus with diabetic chronic kidney disease: Secondary | ICD-10-CM | POA: Diagnosis not present

## 2020-09-25 DIAGNOSIS — R1013 Epigastric pain: Secondary | ICD-10-CM

## 2020-09-25 DIAGNOSIS — Z87891 Personal history of nicotine dependence: Secondary | ICD-10-CM | POA: Insufficient documentation

## 2020-09-25 DIAGNOSIS — R0789 Other chest pain: Secondary | ICD-10-CM | POA: Insufficient documentation

## 2020-09-25 DIAGNOSIS — N189 Chronic kidney disease, unspecified: Secondary | ICD-10-CM | POA: Insufficient documentation

## 2020-09-25 DIAGNOSIS — R079 Chest pain, unspecified: Secondary | ICD-10-CM | POA: Diagnosis present

## 2020-09-25 DIAGNOSIS — Z79899 Other long term (current) drug therapy: Secondary | ICD-10-CM | POA: Insufficient documentation

## 2020-09-25 DIAGNOSIS — J45909 Unspecified asthma, uncomplicated: Secondary | ICD-10-CM | POA: Diagnosis not present

## 2020-09-25 LAB — BASIC METABOLIC PANEL
Anion gap: 9 (ref 5–15)
BUN: 11 mg/dL (ref 6–20)
CO2: 23 mmol/L (ref 22–32)
Calcium: 9.3 mg/dL (ref 8.9–10.3)
Chloride: 106 mmol/L (ref 98–111)
Creatinine, Ser: 0.54 mg/dL (ref 0.44–1.00)
GFR, Estimated: >60 mL/min (ref >60)
Glucose, Bld: 94 mg/dL (ref 70–99)
Potassium: 3.5 mmol/L (ref 3.5–5.1)
Sodium: 138 mmol/L (ref 135–145)

## 2020-09-25 LAB — CBC
HCT: 38.4 % (ref 36.0–46.0)
Hemoglobin: 13 g/dL (ref 12.0–15.0)
MCH: 31.9 pg (ref 26.0–34.0)
MCHC: 33.9 g/dL (ref 30.0–36.0)
MCV: 94.3 fL (ref 80.0–100.0)
Platelets: 207 10*3/uL (ref 150–400)
RBC: 4.07 MIL/uL (ref 3.87–5.11)
RDW: 12.3 % (ref 11.5–15.5)
WBC: 11.1 10*3/uL — ABNORMAL HIGH (ref 4.0–10.5)
nRBC: 0 % (ref 0.0–0.2)

## 2020-09-25 LAB — TROPONIN I (HIGH SENSITIVITY)
Troponin I (High Sensitivity): 4 ng/L (ref <18)
Troponin I (High Sensitivity): 4 ng/L (ref <18)

## 2020-09-25 LAB — I-STAT BETA HCG BLOOD, ED (MC, WL, AP ONLY): I-stat hCG, quantitative: <5 m[IU]/mL (ref <5)

## 2020-09-25 NOTE — ED Triage Notes (Signed)
EMS reports pt is from home. Pt started having chest pain several months ago. Went to primary care doctor - MD prescribed ativan last month. Started on propanolol BID on 09/22/2020. Pain has increased - Left breast radiating to left upper extremity. Given ASA 324mg  PO, Nitro x 1 - pain unchanged, zofran 4mg  IVP. BP - 138/86, HR - 82, O2 sat 96% RA, RR - 20. Reports SOB and vomiting, and a burning pain in throat. Denies fever.

## 2020-09-26 MED ORDER — LIDOCAINE VISCOUS HCL 2 % MT SOLN
15.0000 mL | Freq: Once | OROMUCOSAL | Status: AC
  Administered 2020-09-26: 15 mL via ORAL
  Filled 2020-09-26: qty 15

## 2020-09-26 MED ORDER — ALUM & MAG HYDROXIDE-SIMETH 200-200-20 MG/5ML PO SUSP
30.0000 mL | Freq: Once | ORAL | Status: AC
  Administered 2020-09-26: 30 mL via ORAL
  Filled 2020-09-26: qty 30

## 2020-09-26 MED ORDER — HYOSCYAMINE SULFATE 0.125 MG SL SUBL
0.2500 mg | SUBLINGUAL_TABLET | Freq: Once | SUBLINGUAL | Status: AC
  Administered 2020-09-26: 0.25 mg via SUBLINGUAL
  Filled 2020-09-26: qty 2

## 2020-09-26 MED ORDER — PANTOPRAZOLE SODIUM 20 MG PO TBEC: 20.0000 mg | DELAYED_RELEASE_TABLET | Freq: Every day | ORAL | 0 refills | Status: AC

## 2020-09-26 MED ORDER — LIDOCAINE VISCOUS HCL 2 % MT SOLN: 15.0000 mL | OROMUCOSAL | 0 refills | Status: AC | PRN

## 2020-09-26 NOTE — Discharge Instructions (Addendum)
Thank you for allowing me to care for you today in the Emergency Department.   Your work-up for your chest pain today was reassuring for your heart.  You did have some very mild bronchitis on your chest x-ray.  You can use your home albuterol inhaler for 2 puffs every 4 hours as needed for shortness of breath or wheezing.  However, I suspect that most of your pain today is related to your esophagus.  Start taking 1 tablet of Protonix daily.  You can swallow 15 mL of viscous lidocaine every 3 hours as needed for chest pain.  You can also try taking Maalox, which is available over-the-counter, as prescribed on the label.  Call to schedule a follow-up appointment with gastroenterology.  Their office information is listed above.  Return to the emergency department if you develop chest pain with exertion, respiratory distress, uncontrollable vomiting, if you pass out, if your fingers or lips turn blue, or other new, concerning symptoms

## 2020-09-26 NOTE — ED Provider Notes (Signed)
[facility] EMERGENCY DEPARTMENT Provider Note   CSN: [id number] Arrival date & time: 09/25/20  1931     History Chief Complaint  Patient presents with  . Chest Pain    Megan Castaneda is a 35 y.o. female with history of fibromyalgia, PID, kidney stones, anxiety, and asthma presents the emergency department with a chief complaint of chest pain.  Patient reports that she has been having sharp, throbbing chest pain for many, many months.  She states that the pain starts in the middle of her chest and will radiate across the left side of her chest and to the top of her left arm.  She reports that she has been seen by her PCP for this several times and is currently being treated with propranolol for anxiety.  She has noticed that her chest pain is worse after eating.  She reports that sometimes she will stop to start eating early because her pain will significantly worsen.  When her pain is most severe she will sometimes start to feel short of breath, but she denies feeling short of breath at this time.  She denies any solid or liquid dysphagia.  However, she does sometimes report that she has a globus sensation in her throat, but denies having a sore throat.  Today, the patient was eating at [employer] and had to stop eating early due to worsening pain.  She reports that this is the worst that the pain has been, which prompted her to come to the emergency department for further work-up and evaluation.  She denies diaphoresis, dizziness, lightheadedness, leg swelling, palpitations, back pain, abdominal pain, nausea, vomiting, diarrhea, melena, hematochezia, or choking.  No weight loss or night sweats.  She does not drink alcohol.  She does endorse marijuana and tobacco use.  No other illicit or recreational substance use.  Of note, patient says she was recently seen by her PCP and was diagnosed with multiple rib fractures after a fall several weeks ago.  Chest x-ray with mild  peribronchial thickening.  She has a mild leukocytosis.  Lungs are clear to auscultation bilaterally on my exam.  No metabolic derangements.  Given her symptoms, this is very concerning for esophageal etiology.  She does not describe any solid food dysphagia.  It does not sound as if she is having an esophageal impaction.  Trial the patient with a GI cocktail in the emergency department and she reported significant improvement in her symptoms.  Notably, the patient mentioned that she was diagnosed with multiple rib fractures on a chest x-ray by her PCP several weeks ago after a fall.  I do not see any results visible in care everywhere.  She has no tenderness palpation on the chest wall.  I did also review a previous ER visit where she was seen at [facility] and had said that she presented after having an abnormal outpatient x-ray for rib fractures 1 month ago she underwent CT chest with and without contrast in the ER and it was negative?  I have a low suspicion for tension pneumothorax, aortic dissection, PE, ACS, esophageal impaction, esophageal rupture.  At this time, the patient is hemodynamically stable no acute distress.  Safer discharge home with outpatient follow-up with GI.  Referral given.   The history is provided by the patient and medical records. No language interpreter was used.       Past Medical History:  Diagnosis Date  . Anxiety    Zoloft for anxiety and PPD  .  Asthma    triggered by dust;colds;has inhaler prn but doesn't use/not needed  . Chlamydia 2006   was treated  . Chronic kidney disease    kidney stones-last incident was October  . Complication of anesthesia 2011   Has ongoing shoulder and back pain from epidural  . Depression    PPD-improved w/Zoloft  . Diabetes mellitus without complication (HCC)    Pt. was diagnosed with Gestational Diabetes with recent pregnancy  . Fibromyalgia    not meds for this at this time-tylenol prn  . Gonorrhea 2006   was treated  .  Infection    Yeast;not frequent  . Infection    UTI;not frequent  . PID (acute pelvic inflammatory disease) 2006   Antibxs;found out after + GC/CT  . Urinary tract infection     Patient Active Problem List   Diagnosis Date Noted  . PROM (premature rupture of membranes) 10/20/2012  . LGA (large for gestational age) infant 10/18/2012  . Gestational diabetes 06/26/2012  . Multiple drug allergies--ibuprophen, amoxicillin, sulfa 05/29/2012  . Ear infection 05/28/2012  . Kidney stone 05/02/2012  . Obesity 04/30/2012  . Anxiety 04/30/2012    Past Surgical History:  Procedure Laterality Date  . APPENDECTOMY  05/2008  . BILATERAL SALPINGECTOMY Bilateral 12/18/2012   Procedure: BILATERAL SALPINGECTOMY;  Surgeon: Michael Litter, MD;  Location: [facility];  Service: Gynecology;  Laterality: Bilateral;  . CESAREAN SECTION N/A 10/20/2012   Procedure: Primary CESAREAN SECTION  of baby boy  at 2336 APGAR 7/9;  Surgeon: Michael Litter, MD;  Location: [facility];  Service: Obstetrics;  Laterality: N/A;  . LAPAROSCOPY N/A 12/18/2012   Procedure: LAPAROSCOPY OPERATIVE;  Surgeon: Michael Litter, MD;  Location: [facility];  Service: Gynecology;  Laterality: N/A;  see OR note  . WISDOM TOOTH EXTRACTION  1999   All 4 removed     OB History    Gravida  2   Para  2   Term  1   Preterm  1   AB      Living  2     SAB      IAB      Ectopic      Multiple      Live Births  2           Family History  Problem Relation Age of Onset  . Thyroid disease Mother   . Cancer Mother        Gallbladder  . Depression Mother   . Anxiety disorder Mother   . Fibromyalgia Mother   . Diabetes Maternal Grandmother   . Bipolar disorder Maternal Grandmother   . Depression Maternal Grandmother   . Anxiety disorder Maternal Grandmother   . Fibromyalgia Maternal Grandmother   . Diabetes Maternal Grandfather   . Dementia Paternal Grandmother   . Dementia Paternal Grandfather   . Other Neg Hx      Social History   Tobacco Use  . Smoking status: Former Smoker    Types: Cigarettes    Quit date: 07/25/2010    Years since quitting: 10.1  . Smokeless tobacco: Never Used  Substance Use Topics  . Alcohol use: No  . Drug use: Yes    Frequency: 2.0 times per week    Types: Marijuana    Comment: Quit after + UPT    Home Medications Prior to Admission medications   Medication Sig Start Date End Date Taking? Authorizing Provider  lidocaine (XYLOCAINE) 2 % solution Use as directed  15 mLs in the mouth or throat every 3 (three) hours as needed for mouth pain. 09/26/20  Yes McDonald, Mia A, PA-C  pantoprazole (PROTONIX) 20 MG tablet Take 1 tablet (20 mg total) by mouth daily. 09/26/20  Yes McDonald, Mia A, PA-C  acetaminophen (TYLENOL) 325 MG tablet Take 650 mg by mouth every 6 (six) hours as needed for pain.    [provider]  cyclobenzaprine (FLEXERIL) 10 MG tablet Take 10 mg by mouth 2 (two) times daily as needed for muscle spasms.    [provider]  diazepam (VALIUM) 5 MG tablet Take 1 tablet (5 mg total) by mouth 2 (two) times daily. 03/22/13   Hess, Nada Boozerobyn M, PA-C  docusate sodium (COLACE) 100 MG capsule Take 200 mg by mouth daily. For bm    [provider]  oxyCODONE-acetaminophen (PERCOCET/ROXICET) 5-325 MG per tablet Take 1 tablet by mouth as needed. 12/18/12   Jaymes Graffillard, Naima, MD  Prenatal Vit-Fe Fumarate-FA (PRENATAL MULTIVITAMIN) TABS Take 1 tablet by mouth daily at 12 noon.    [provider]  sertraline (ZOLOFT) 50 MG tablet Take 1 tablet (50 mg total) by mouth daily. 10/23/12   Haroldine Lawsxley, Jennifer, CNM    Allergies    Motrin [ibuprofen], Amoxicillin, and Sulfa antibiotics  Review of Systems   Review of Systems  Constitutional: Negative for activity change, chills, diaphoresis and fever.  HENT: Negative for congestion and sore throat.        Globus sensation  Respiratory: Positive for shortness of breath. Negative for cough, choking, chest  tightness and wheezing.   Cardiovascular: Positive for chest pain. Negative for palpitations and leg swelling.  Gastrointestinal: Negative for abdominal pain, constipation, diarrhea, nausea and vomiting.  Genitourinary: Negative for dysuria.  Musculoskeletal: Negative for back pain.  Skin: Negative for rash.  Allergic/Immunologic: Negative for immunocompromised state.  Neurological: Negative for seizures, syncope, weakness, numbness and headaches.  Psychiatric/Behavioral: Negative for confusion.    Physical Exam Updated Vital Signs BP 140/70   Pulse 78   Temp 98.4 F (36.9 C) (Oral)   Resp 10   Ht 5\' 2"  (1.575 m)   Wt 108.9 kg   LMP 09/18/2020 (Approximate)   SpO2 98%   BMI 43.90 kg/m   Physical Exam Vitals and nursing note reviewed.  Constitutional:      General: She is not in acute distress. HENT:     Head: Normocephalic.     Mouth/Throat:     Comments: Posterior oropharynx is patent.  No exudates, erythema, or edema.  Uvula is midline.  Tolerating secretions without difficulty. Eyes:     Conjunctiva/sclera: Conjunctivae normal.  Neck:     Comments: No cervical lymphadenopathy.  Trachea is nontender. Cardiovascular:     Rate and Rhythm: Normal rate and regular rhythm.     Heart sounds: No murmur heard. No friction rub. No gallop.   Pulmonary:     Effort: Pulmonary effort is normal. No respiratory distress.     Breath sounds: No stridor. No wheezing, rhonchi or rales.     Comments: Lungs are clear to auscultation bilaterally.  No increased work of breathing.  Chest wall is nontender to palpation. Chest:     Chest wall: No tenderness.  Abdominal:     General: There is no distension.     Palpations: Abdomen is soft. There is no mass.     Tenderness: There is no abdominal tenderness. There is no right CVA tenderness, left CVA tenderness, guarding or rebound.  Hernia: No hernia is present.     Comments: Abdomen is soft, nontender, nondistended.  Musculoskeletal:         General: No tenderness.     Cervical back: Neck supple.     Right lower leg: No edema.  Skin:    General: Skin is warm.     Findings: No rash.  Neurological:     Mental Status: She is alert.     Coordination: Abnormal coordination:   Psychiatric:        Behavior: Behavior normal.     ED Results / Procedures / Treatments   Labs (all labs ordered are listed, but only abnormal results are displayed) Labs Reviewed  CBC - Abnormal; Notable for the following components:      Result Value   WBC 11.1 (*)    All other components within normal limits  BASIC METABOLIC PANEL  I-STAT BETA HCG BLOOD, ED ([facility], [facility], [facility] ONLY)  TROPONIN I (HIGH SENSITIVITY)  TROPONIN I (HIGH SENSITIVITY)    EKG EKG Interpretation  Date/Time:  Friday September 25 2020 19:42:50 EST Ventricular Rate:  79 PR Interval:  152 QRS Duration: 84 QT Interval:  408 QTC Calculation: 467 R Axis:   72 Text Interpretation: Normal sinus rhythm Normal ECG No acute changes Confirmed by Marily Memos [id number]) on 09/26/2020 12:44:13 AM   Radiology DG Chest 2 View  Result Date: 09/25/2020 CLINICAL DATA:  Chest pain.  Shortness of breath.  Cough. EXAM: CHEST - 2 VIEW COMPARISON:  01/14/2006 FINDINGS: The cardiomediastinal contours are normal. Mild bronchial thickening. Pulmonary vasculature is normal. No consolidation, pleural effusion, or pneumothorax. No acute osseous abnormalities are seen. IMPRESSION: Mild bronchial thickening can be seen with bronchitis or asthma. Electronically Signed   By: Narda Rutherford M.D.   On: 09/25/2020 20:34    Procedures Procedures   Medications Ordered in ED Medications  hyoscyamine (LEVSIN SL) SL tablet 0.25 mg (0.25 mg Sublingual Given 09/26/20 0248)  alum & mag hydroxide-simeth (MAALOX/MYLANTA) 200-200-20 MG/5ML suspension 30 mL (30 mLs Oral Given 09/26/20 0248)    And  lidocaine (XYLOCAINE) 2 % viscous mouth solution 15 mL (15 mLs Oral Given 09/26/20 0248)    ED Course  I have reviewed  the triage vital signs and the nursing notes.  Pertinent labs & imaging results that were available during my care of the patient were reviewed by me and considered in my medical decision making (see chart for details).    MDM Rules/Calculators/A&P                          35 year old female with history of fibromyalgia, PID, kidney stones, anxiety, and asthma who presents to the emergency department with chest pain that has been going on for months.  Pain is worse with eating and causes worsening pain.  She does intermittently report some globus sensation or food, but she has had no solid food dysphagia and I am not concerned that she is having esophageal impaction.  Vital signs are normal.  Labs been reviewed and independently interpreted by me.  EKG with normal sinus rhythm.  Delta troponin is not elevated.  Heart score is low risk.    Final Clinical Impression(s) / ED Diagnoses Final diagnoses:  Atypical chest pain  Esophagitis  Dyspepsia    Rx / DC Orders ED Discharge Orders         Ordered    pantoprazole (PROTONIX) 20 MG tablet  Daily  09/26/20 0332    lidocaine (XYLOCAINE) 2 % solution  Every  3 hours PRN        09/26/20 0332           Frederik Pear A, PA-C 09/26/20 0836    Little, Ambrose Finland, MD 09/26/20 2009

## 2020-10-18 ENCOUNTER — Other Ambulatory Visit: Payer: Self-pay

## 2020-10-18 ENCOUNTER — Emergency Department (HOSPITAL_COMMUNITY)
Admission: EM | Admit: 2020-10-18 | Discharge: 2020-10-18 | Disposition: A | Discharge status: Home / Self Care | Payer: Medicaid - Out of State | Attending: Emergency Medicine | Admitting: Emergency Medicine

## 2020-10-18 DIAGNOSIS — R0789 Other chest pain: Secondary | ICD-10-CM | POA: Diagnosis not present

## 2020-10-18 DIAGNOSIS — E1122 Type 2 diabetes mellitus with diabetic chronic kidney disease: Secondary | ICD-10-CM | POA: Insufficient documentation

## 2020-10-18 DIAGNOSIS — R198 Other specified symptoms and signs involving the digestive system and abdomen: Secondary | ICD-10-CM

## 2020-10-18 DIAGNOSIS — R1310 Dysphagia, unspecified: Secondary | ICD-10-CM | POA: Diagnosis present

## 2020-10-18 DIAGNOSIS — N189 Chronic kidney disease, unspecified: Secondary | ICD-10-CM | POA: Diagnosis not present

## 2020-10-18 DIAGNOSIS — R0989 Other specified symptoms and signs involving the circulatory and respiratory systems: Secondary | ICD-10-CM | POA: Diagnosis not present

## 2020-10-18 DIAGNOSIS — J45909 Unspecified asthma, uncomplicated: Secondary | ICD-10-CM | POA: Insufficient documentation

## 2020-10-18 DIAGNOSIS — Z87891 Personal history of nicotine dependence: Secondary | ICD-10-CM | POA: Insufficient documentation

## 2020-10-18 LAB — CBC WITH DIFFERENTIAL/PLATELET
Abs Immature Granulocytes: 0.03 10*3/uL (ref 0.00–0.07)
Basophils Absolute: 0 10*3/uL (ref 0.0–0.1)
Basophils Relative: 0 %
Eosinophils Absolute: 0.1 10*3/uL (ref 0.0–0.5)
Eosinophils Relative: 1 %
HCT: 38.4 % (ref 36.0–46.0)
Hemoglobin: 13.2 g/dL (ref 12.0–15.0)
Immature Granulocytes: 0 %
Lymphocytes Relative: 28 %
Lymphs Abs: 2.4 10*3/uL (ref 0.7–4.0)
MCH: 32.4 pg (ref 26.0–34.0)
MCHC: 34.4 g/dL (ref 30.0–36.0)
MCV: 94.3 fL (ref 80.0–100.0)
Monocytes Absolute: 0.6 10*3/uL (ref 0.1–1.0)
Monocytes Relative: 6 %
Neutro Abs: 5.5 10*3/uL (ref 1.7–7.7)
Neutrophils Relative %: 65 %
Platelets: 198 10*3/uL (ref 150–400)
RBC: 4.07 MIL/uL (ref 3.87–5.11)
RDW: 12.1 % (ref 11.5–15.5)
WBC: 8.6 10*3/uL (ref 4.0–10.5)
nRBC: 0 % (ref 0.0–0.2)

## 2020-10-18 LAB — BASIC METABOLIC PANEL
Anion gap: 6 (ref 5–15)
BUN: 14 mg/dL (ref 6–20)
CO2: 25 mmol/L (ref 22–32)
Calcium: 9 mg/dL (ref 8.9–10.3)
Chloride: 107 mmol/L (ref 98–111)
Creatinine, Ser: 0.69 mg/dL (ref 0.44–1.00)
GFR, Estimated: >60 mL/min (ref >60)
Glucose, Bld: 85 mg/dL (ref 70–99)
Potassium: 4.2 mmol/L (ref 3.5–5.1)
Sodium: 138 mmol/L (ref 135–145)

## 2020-10-18 LAB — TROPONIN I (HIGH SENSITIVITY): Troponin I (High Sensitivity): 4 ng/L (ref <18)

## 2020-10-18 MED ORDER — LORAZEPAM 2 MG/ML IJ SOLN
0.5000 mg | Freq: Once | INTRAMUSCULAR | Status: AC
  Administered 2020-10-18: 0.5 mg via INTRAVENOUS
  Filled 2020-10-18: qty 1

## 2020-10-18 MED ORDER — ALUM & MAG HYDROXIDE-SIMETH 200-200-20 MG/5ML PO SUSP
30.0000 mL | Freq: Once | ORAL | Status: AC
  Administered 2020-10-18: 30 mL via ORAL
  Filled 2020-10-18: qty 30

## 2020-10-18 MED ORDER — LIDOCAINE VISCOUS HCL 2 % MT SOLN
15.0000 mL | Freq: Once | OROMUCOSAL | Status: AC
  Administered 2020-10-18: 15 mL via ORAL
  Filled 2020-10-18: qty 15

## 2020-10-18 NOTE — ED Triage Notes (Signed)
Pt to ED via POV with c/o sore throat. Pt states she was started on new medication for chest pain and feels like her throat is closing. Patient speaking in full sentences, airway clear and intact.

## 2020-10-18 NOTE — ED Provider Notes (Signed)
[facility] Provider Note   CSN: [id number] Arrival date & time: 10/18/20  1159     History Chief Complaint  Patient presents with  . Sore Throat    Lane Hackershley D Stave is a 35 y.o. female.  Patient is a 35 year old female who presents with difficulty swallowing.  She said that for the last month or so she has had some increased GERD symptoms and feeling like she has something stuck in her throat.  She said when not feeling comes on she gets associated chest pain.  The pain in her throat radiates to her upper chest and she feels short of breath.  She says she premeds has a symptom daily.  She was going into the store to buy some birthday party stop for her son's birthday party today and got very anxious and panicky and felt the symptoms were getting worse and that she might "fall out" and so she decided come here to get checked out.  She has been seen in the emergency department a couple times for this recently.  She has had a CT angio of her chest which was negative.  She had a GI cocktail which she says actually made her symptoms a lot better for a while until it wore off.  She has been taking famotidine with some improvement in symptoms.  She is [facility] to be [facility] but has not yet done that.  She currently denies any shortness of breath.  She is able to swallow things down without anything getting stuck.  She does not feel like her airway is closing.  She does not have any soreness to the back of her throat.  She is concerned that she may have worms in her throat        Past Medical History:  Diagnosis Date  . Anxiety    Zoloft for anxiety and PPD  . Asthma    triggered by dust;colds;has inhaler prn but doesn't use/not needed  . Chlamydia 2006   was treated  . Chronic kidney disease    kidney stones-last incident was October  . Complication of anesthesia 2011   Has ongoing shoulder  and back pain from epidural  . Depression    PPD-improved w/Zoloft  . Diabetes mellitus without complication (HCC)    Pt. was diagnosed with Gestational Diabetes with recent pregnancy  . Fibromyalgia    not meds for this at this time-tylenol prn  . Gonorrhea 2006   was treated  . Infection    Yeast;not frequent  . Infection    UTI;not frequent  . PID (acute pelvic inflammatory disease) 2006   Antibxs;found out after + GC/CT  . Urinary tract infection     Patient Active Problem List   Diagnosis Date Noted  . PROM (premature rupture of membranes) 10/20/2012  . LGA (large for gestational age) infant 10/18/2012  . Gestational diabetes 06/26/2012  . Multiple drug allergies--ibuprophen, amoxicillin, sulfa 05/29/2012  . Ear infection 05/28/2012  . Kidney stone 05/02/2012  . Obesity 04/30/2012  . Anxiety 04/30/2012    Past Surgical History:  Procedure Laterality Date  . APPENDECTOMY  05/2008  . BILATERAL SALPINGECTOMY Bilateral 12/18/2012   Procedure: BILATERAL SALPINGECTOMY;  Surgeon: Michael LitterNaima A Dillard, MD;  Location: [facility];  Service: Gynecology;  Laterality: Bilateral;  . CESAREAN SECTION N/A 10/20/2012   Procedure: Primary CESAREAN SECTION  of baby boy  at 2336 APGAR 7/9;  Surgeon: Michael LitterNaima A Dillard, MD;  Location:  [facility];  Service: Obstetrics;  Laterality: N/A;  . LAPAROSCOPY N/A 12/18/2012   Procedure: LAPAROSCOPY OPERATIVE;  Surgeon: Michael Litter, MD;  Location: [facility];  Service: Gynecology;  Laterality: N/A;  see OR note  . WISDOM TOOTH EXTRACTION  1999   All 4 removed     OB History    Gravida  2   Para  2   Term  1   Preterm  1   AB      Living  2     SAB      IAB      Ectopic      Multiple      Live Births  2           Family History  Problem Relation Age of Onset  . Thyroid disease Mother   . Cancer Mother        Gallbladder  . Depression Mother   . Anxiety disorder Mother   . Fibromyalgia Mother   . Diabetes Maternal Grandmother   .  Bipolar disorder Maternal Grandmother   . Depression Maternal Grandmother   . Anxiety disorder Maternal Grandmother   . Fibromyalgia Maternal Grandmother   . Diabetes Maternal Grandfather   . Dementia Paternal Grandmother   . Dementia Paternal Grandfather   . Other Neg Hx     Social History   Tobacco Use  . Smoking status: Former Smoker    Types: Cigarettes    Quit date: 07/25/2010    Years since quitting: 10.2  . Smokeless tobacco: Never Used  Substance Use Topics  . Alcohol use: No  . Drug use: Yes    Frequency: 2.0 times per week    Types: Marijuana    Comment: Quit after + UPT    Home Medications Prior to Admission medications   Medication Sig Start Date End Date Taking? Authorizing Provider  acetaminophen (TYLENOL) 325 MG tablet Take 650 mg by mouth every 6 (six) hours as needed for pain.    [provider]  cyclobenzaprine (FLEXERIL) 10 MG tablet Take 10 mg by mouth 2 (two) times daily as needed for muscle spasms.    [provider]  diazepam (VALIUM) 5 MG tablet Take 1 tablet (5 mg total) by mouth 2 (two) times daily. 03/22/13   Hess, Nada Boozer, PA-C  docusate sodium (COLACE) 100 MG capsule Take 200 mg by mouth daily. For bm    [provider]  lidocaine (XYLOCAINE) 2 % solution Use as directed 15 mLs in the mouth or throat every 3 (three) hours as needed for mouth pain. 09/26/20   McDonald, Mia A, PA-C  oxyCODONE-acetaminophen (PERCOCET/ROXICET) 5-325 MG per tablet Take 1 tablet by mouth as needed. 12/18/12   Jaymes Graff, MD  pantoprazole (PROTONIX) 20 MG tablet Take 1 tablet (20 mg total) by mouth daily. 09/26/20   McDonald, Mia A, PA-C  Prenatal Vit-Fe Fumarate-FA (PRENATAL MULTIVITAMIN) TABS Take 1 tablet by mouth daily at 12 noon.    [provider]  sertraline (ZOLOFT) 50 MG tablet Take 1 tablet (50 mg total) by mouth daily. 10/23/12   Haroldine Laws, CNM    Allergies    Motrin [ibuprofen], Amoxicillin, and Sulfa antibiotics  Review  of Systems   Review of Systems  Constitutional: Negative for chills, diaphoresis, fatigue and fever.  HENT: Negative for congestion, rhinorrhea and sneezing.        Feeling that there is something in her esophagus  Eyes: Negative.   Respiratory: Positive for  chest tightness. Negative for cough and shortness of breath.   Cardiovascular: Positive for chest pain. Negative for leg swelling.  Gastrointestinal: Negative for abdominal pain, blood in stool, diarrhea, nausea and vomiting.  Genitourinary: Negative for difficulty urinating, flank pain, frequency and hematuria.  Musculoskeletal: Negative for arthralgias and back pain.  Skin: Negative for rash.  Neurological: Negative for dizziness, speech difficulty, weakness, numbness and headaches.    Physical Exam Updated Vital Signs BP (!) 112/57   Pulse 81   Temp 98.9 F (37.2 C)   Resp 19   Ht 5\' 2"  (1.575 m)   Wt 108.9 kg   LMP 09/18/2020 (Approximate)   SpO2 100%   BMI 43.91 kg/m   Physical Exam Constitutional:      Appearance: She is well-developed.  HENT:     Head: Normocephalic and atraumatic.     Mouth/Throat:     Comments: No visible swelling, erythema or exudates of the posterior pharynx Eyes:     Pupils: Pupils are equal, round, and reactive to light.  Cardiovascular:     Rate and Rhythm: Normal rate and regular rhythm.     Heart sounds: Normal heart sounds.  Pulmonary:     Effort: Pulmonary effort is normal. No respiratory distress.     Breath sounds: Normal breath sounds. No wheezing or rales.     Comments: No wheezing or stridor Chest:     Chest wall: No tenderness.  Abdominal:     General: Bowel sounds are normal.     Palpations: Abdomen is soft.     Tenderness: There is no abdominal tenderness. There is no guarding or rebound.  Musculoskeletal:        General: Normal range of motion.     Cervical back: Normal range of motion and neck supple.  Lymphadenopathy:     Cervical: No cervical adenopathy.   Skin:    General: Skin is warm and dry.     Findings: No rash.  Neurological:     Mental Status: She is alert and oriented to person, place, and time.     ED Results / Procedures / Treatments   Labs (all labs ordered are listed, but only abnormal results are displayed) Labs Reviewed  BASIC METABOLIC PANEL  CBC WITH DIFFERENTIAL/PLATELET  TROPONIN I (HIGH SENSITIVITY)    EKG EKG Interpretation  Date/Time:  Sunday October 18 2020 12:46:44 EDT Ventricular Rate:  73 PR Interval:    QRS Duration: 102 QT Interval:  403 QTC Calculation: 445 R Axis:   79 Text Interpretation: Sinus rhythm similar to EKG from same day Confirmed by 08[birth year]26[birth year]1992 [id number]) on 10/18/2020 1:09:37 PM   Radiology No results found.  Procedures Procedures   Medications Ordered in ED Medications  alum & mag hydroxide-simeth (MAALOX/MYLANTA) 200-200-20 MG/5ML suspension 30 mL (30 mLs Oral Given 10/18/20 1252)    And  lidocaine (XYLOCAINE) 2 % viscous mouth solution 15 mL (15 mLs Oral Given 10/18/20 1252)  LORazepam (ATIVAN) injection 0.5 mg (0.5 mg Intravenous Given 10/18/20 1252)    ED Course  I have reviewed the triage vital signs and the nursing notes.  Pertinent labs & imaging results that were available during my care of the patient were reviewed by me and considered in my medical decision making (see chart for details).    MDM Rules/Calculators/A&P                          Patient is a 35 year old female who  presents with a sensation that she has something in her throat with some swelling in her throat.  She has had the symptoms off and on for the last month.  When she gets the symptoms she also has associated chest pain.  Chest pain seems to be related to the discomfort in her throat.  Her labs are nonconcerning.  Her EKG does not show any ischemic changes.  Her troponin is negative.  She does not have other associated symptoms that would be more concerning for ACS.  She does not have any  difficulty swallowing and is able to eat and drink normally which would go against an esophageal foreign body.  Her symptoms sound more consistent with possible GERD.  She does not have any sensation of airway swelling or angioedema.  She felt much better after a GI cocktail and a little dose of Ativan.  She said her symptoms premeds resolved after this although she was starting to feel them come back.  Will encourage her to continue her antiacid medicines and follow-up closely with the gastroenterologist with [facility] that she was supposed to make an appointment with.  Return precautions were given. Final Clinical Impression(s) / ED Diagnoses Final diagnoses:  Globus sensation  Atypical chest pain    Rx / DC Orders ED Discharge Orders    None       Rolan Bucco, MD 10/18/20 1506

## 2020-10-18 NOTE — Discharge Instructions (Signed)
Follow-up with your gastroenterologist as discussed.  Return here as needed for any worsening symptoms.

## 2022-05-22 IMAGING — DX DG CHEST 2V
2 series · 2 of 2 positions shown · non-contrast
Comparison: 01/14/2006

CLINICAL DATA: Chest pain.  Shortness of breath.  Cough.

EXAM:
CHEST - 2 VIEW

[chest lat]
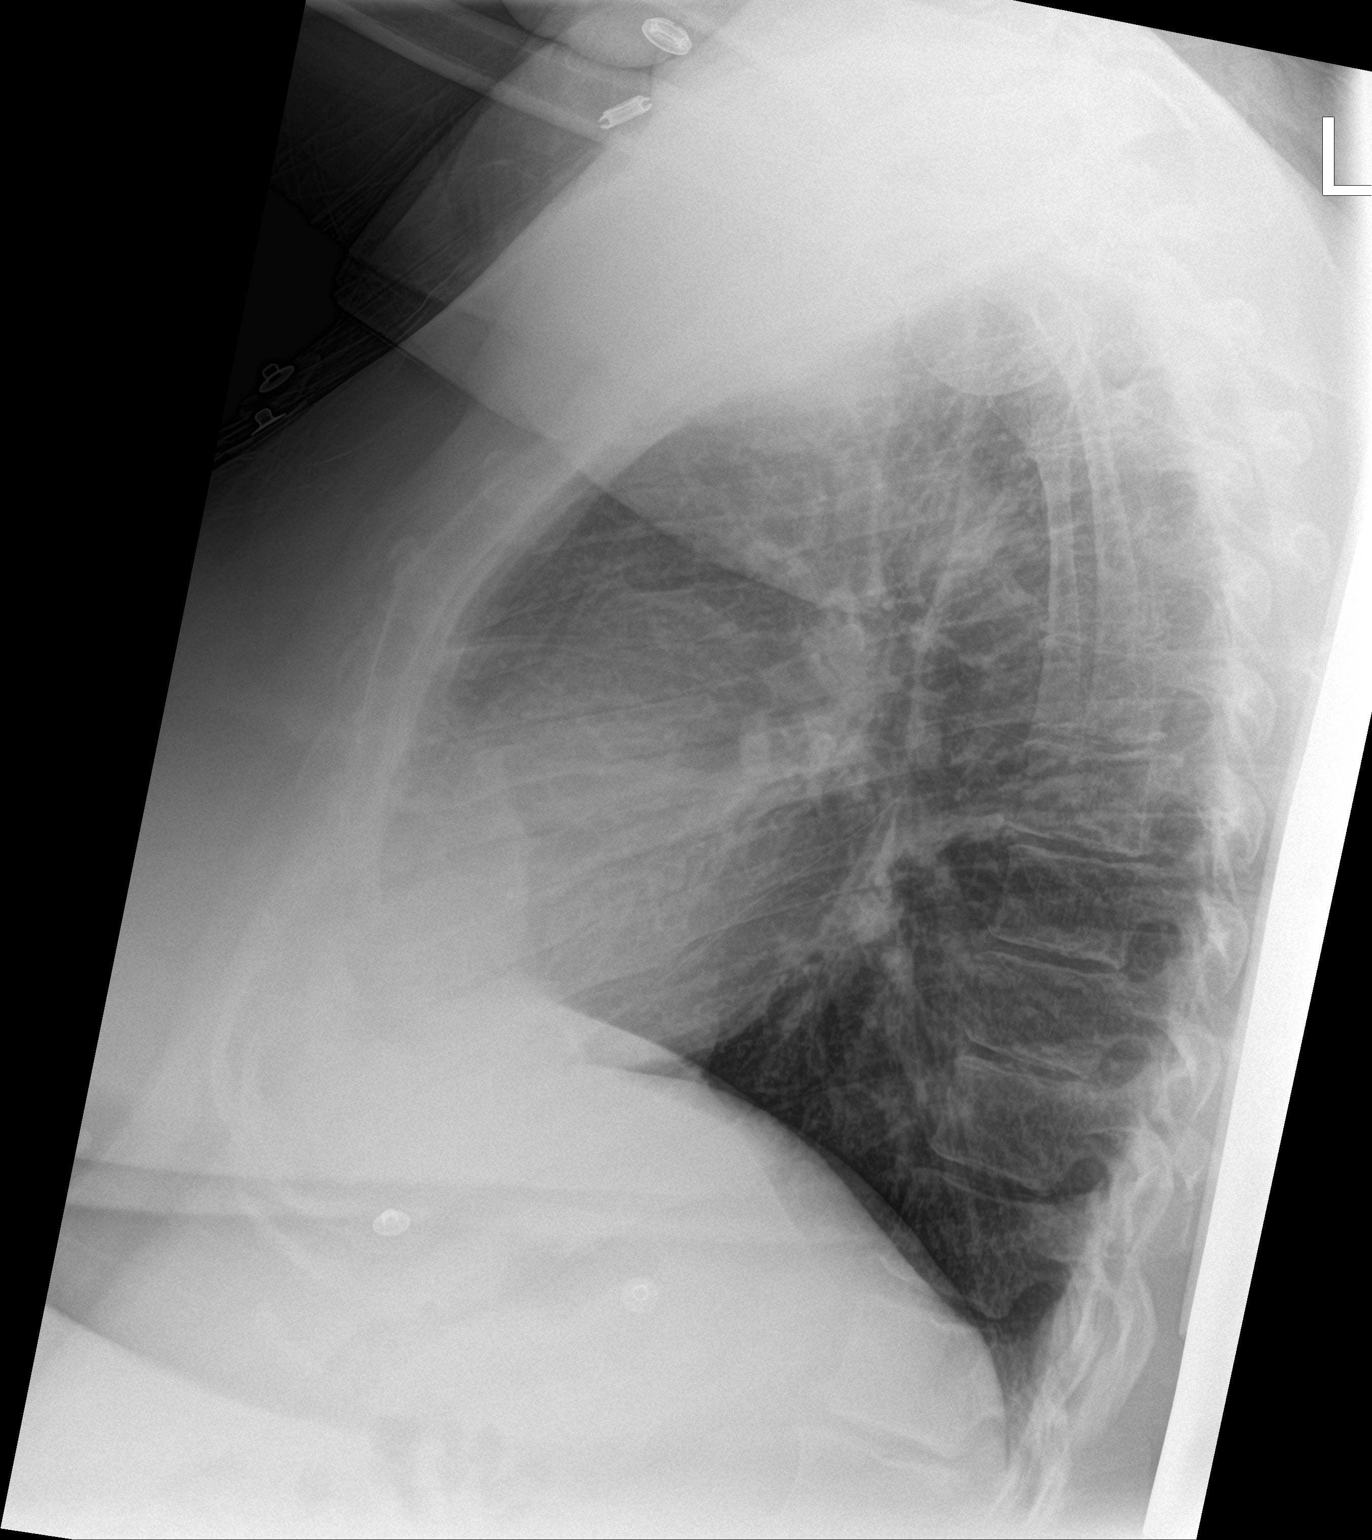

[chest ap]
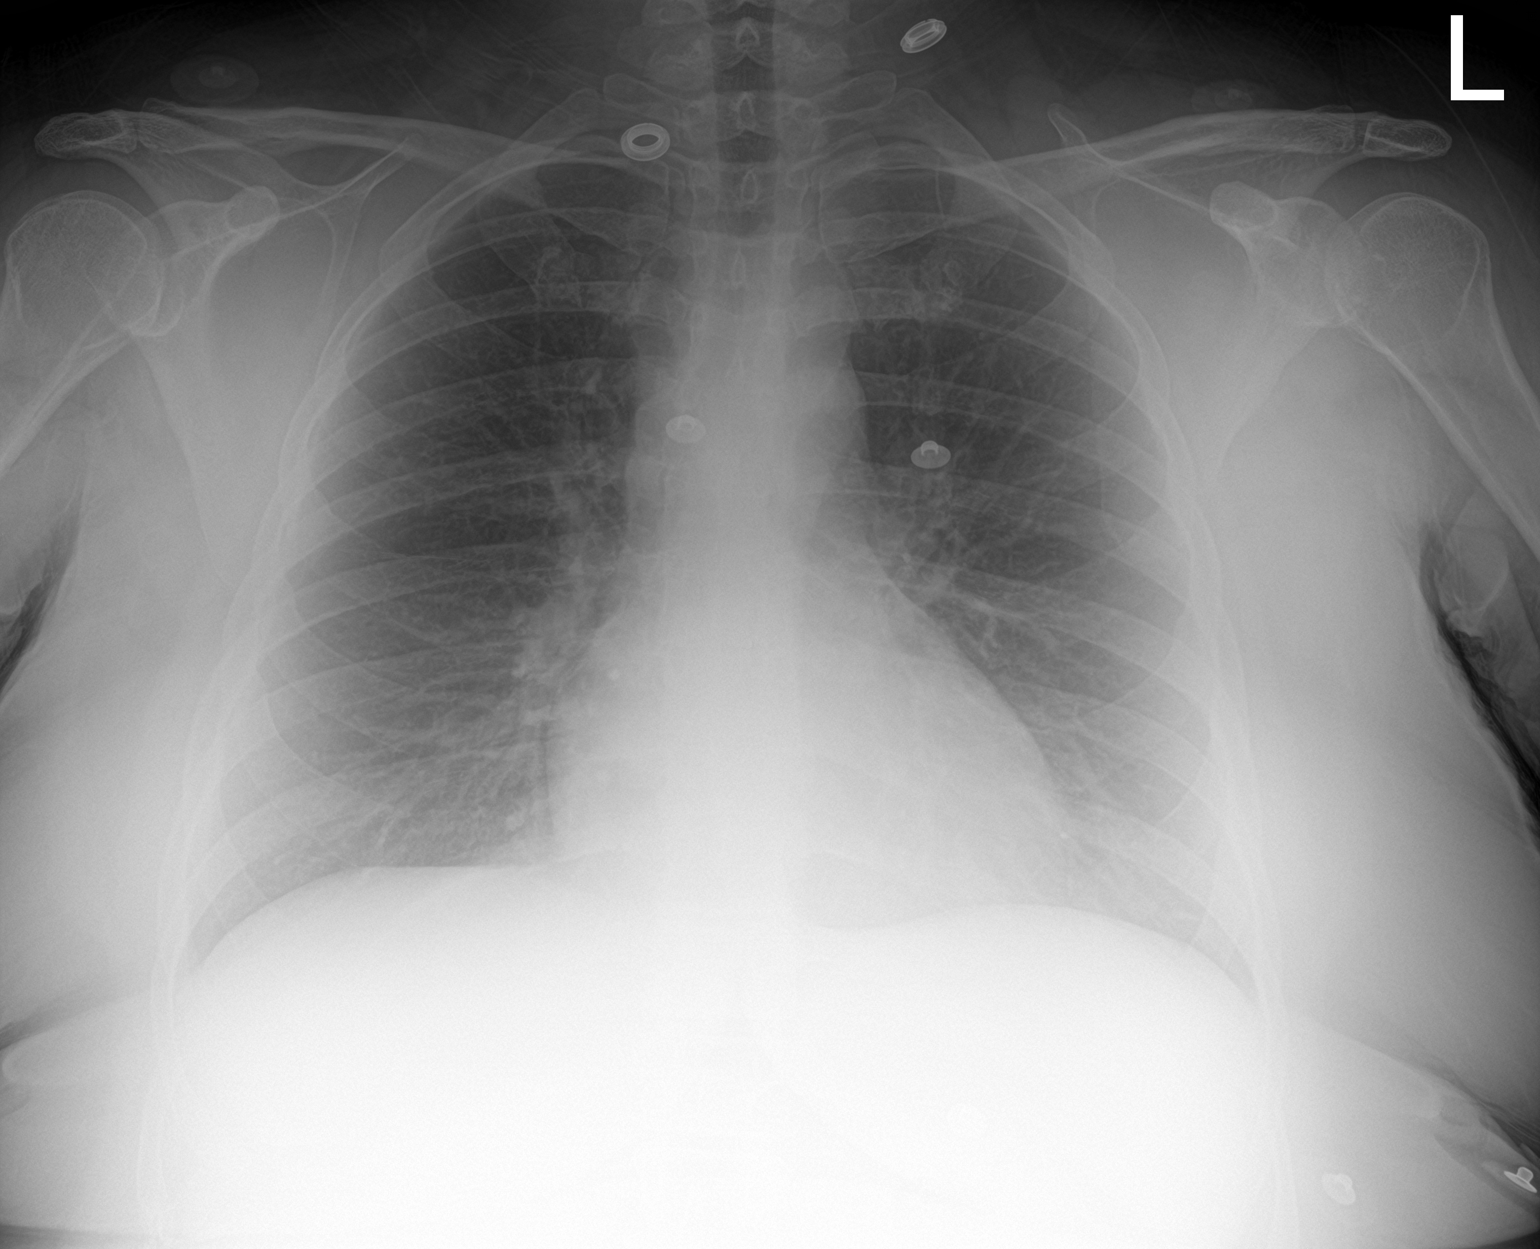

[2 of 2 positions shown; findings below may reference images not displayed]

FINDINGS: The cardiomediastinal contours are normal. Mild bronchial
thickening. Pulmonary vasculature is normal. No consolidation,
pleural effusion, or pneumothorax. No acute osseous abnormalities
are seen.
IMPRESSION: Mild bronchial thickening can be seen with bronchitis or asthma.

## 2023-11-15 ENCOUNTER — Other Ambulatory Visit: Payer: Self-pay | Admitting: Physician Assistant
